# Patient Record
Sex: Female | Born: 1950 | Race: White | Hispanic: No | Marital: Married | Smoking: Never smoker
Health system: Southern US, Community
[De-identification: ages and names within clinical notes are randomized; demographics above are authoritative.]

## PROBLEM LIST (undated history)

## (undated) DIAGNOSIS — I10 Essential (primary) hypertension: Secondary | ICD-10-CM

## (undated) DIAGNOSIS — C801 Malignant (primary) neoplasm, unspecified: Secondary | ICD-10-CM

## (undated) HISTORY — PX: BREAST SURGERY: SHX581

## (undated) HISTORY — PX: MASTECTOMY: SHX3

---

## 2011-10-10 LAB — COMPREHENSIVE METABOLIC PANEL
Albumin: 3.7 g/dL (ref 3.4–5.0)
Alkaline Phosphatase: 99 U/L (ref 50–136)
BUN: 6 mg/dL — ABNORMAL LOW (ref 7–18)
Bilirubin,Total: 0.6 mg/dL (ref 0.2–1.0)
Calcium, Total: 8.9 mg/dL (ref 8.5–10.1)
Co2: 25 mmol/L (ref 21–32)
Creatinine: 0.67 mg/dL (ref 0.60–1.30)
EGFR (Non-African Amer.): >60
Glucose: 125 mg/dL — ABNORMAL HIGH (ref 65–99)
Osmolality: 265 (ref 275–301)
SGOT(AST): 19 U/L (ref 15–37)
SGPT (ALT): 29 U/L
Sodium: 133 mmol/L — ABNORMAL LOW (ref 136–145)
Total Protein: 7.5 g/dL (ref 6.4–8.2)

## 2011-10-10 LAB — CBC WITH DIFFERENTIAL/PLATELET
HCT: 39.9 % (ref 35.0–47.0)
HGB: 13.3 g/dL (ref 12.0–16.0)
Lymphocyte #: 1.3 10*3/uL (ref 1.0–3.6)
Lymphocyte %: 10.8 %
MCHC: 33.4 g/dL (ref 32.0–36.0)
Monocyte #: 0.9 x10 3/mm (ref 0.2–0.9)
Monocyte %: 7.5 %
Neutrophil #: 9.3 10*3/uL — ABNORMAL HIGH (ref 1.4–6.5)
Neutrophil %: 79.7 %
Platelet: 186 10*3/uL (ref 150–440)
RBC: 4.42 10*6/uL (ref 3.80–5.20)
RDW: 14.5 % (ref 11.5–14.5)
WBC: 11.7 10*3/uL — ABNORMAL HIGH (ref 3.6–11.0)

## 2011-10-10 LAB — TROPONIN I: Troponin-I: <0.02 ng/mL

## 2011-10-10 LAB — TSH: Thyroid Stimulating Horm: 0.78 u[IU]/mL

## 2011-10-11 ENCOUNTER — Inpatient Hospital Stay: Payer: Self-pay | Admitting: Internal Medicine

## 2011-10-12 LAB — CBC WITH DIFFERENTIAL/PLATELET
Basophil #: 0 10*3/uL (ref 0.0–0.1)
Eosinophil #: 0.2 10*3/uL (ref 0.0–0.7)
Eosinophil %: 3.2 %
HCT: 33.1 % — ABNORMAL LOW (ref 35.0–47.0)
Lymphocyte #: 1.1 10*3/uL (ref 1.0–3.6)
Lymphocyte %: 16.5 %
MCHC: 33.3 g/dL (ref 32.0–36.0)
MCV: 90 fL (ref 80–100)
Monocyte #: 0.5 x10 3/mm (ref 0.2–0.9)
Monocyte %: 8.3 %
Neutrophil #: 4.7 10*3/uL (ref 1.4–6.5)
Neutrophil %: 71.5 %
Platelet: 165 10*3/uL (ref 150–440)
RDW: 14.3 % (ref 11.5–14.5)
WBC: 6.6 10*3/uL (ref 3.6–11.0)

## 2011-10-12 LAB — BASIC METABOLIC PANEL
Calcium, Total: 7.4 mg/dL — ABNORMAL LOW (ref 8.5–10.1)
Creatinine: 0.41 mg/dL — ABNORMAL LOW (ref 0.60–1.30)
EGFR (African American): >60
EGFR (Non-African Amer.): >60
Glucose: 99 mg/dL (ref 65–99)
Osmolality: 270 (ref 275–301)
Potassium: 3.8 mmol/L (ref 3.5–5.1)

## 2011-10-14 LAB — EXPECTORATED SPUTUM ASSESSMENT W GRAM STAIN, RFLX TO RESP C

## 2011-10-15 LAB — BASIC METABOLIC PANEL
BUN: 5 mg/dL — ABNORMAL LOW (ref 7–18)
Calcium, Total: 8.1 mg/dL — ABNORMAL LOW (ref 8.5–10.1)
Chloride: 110 mmol/L — ABNORMAL HIGH (ref 98–107)
Co2: 25 mmol/L (ref 21–32)
Creatinine: 0.5 mg/dL — ABNORMAL LOW (ref 0.60–1.30)
EGFR (African American): >60
EGFR (Non-African Amer.): >60
Osmolality: 286 (ref 275–301)
Potassium: 3.5 mmol/L (ref 3.5–5.1)
Sodium: 145 mmol/L (ref 136–145)

## 2011-10-16 LAB — CULTURE, BLOOD (SINGLE)

## 2012-12-02 ENCOUNTER — Ambulatory Visit: Payer: Self-pay | Admitting: Otolaryngology

## 2012-12-02 LAB — CREATININE, SERUM: Creatinine: 0.75 mg/dL (ref 0.60–1.30)

## 2013-03-20 ENCOUNTER — Ambulatory Visit: Payer: Self-pay

## 2013-04-03 IMAGING — CR DG CHEST 2V
1 series · 2 of 2 positions shown · non-contrast
Comparison: none

REASON FOR EXAM: follow up pneumonia
COMMENTS:

PROCEDURE:     DXR - DXR CHEST PA (OR AP) AND LATERAL  - October 14, 2011  [DATE]
RESULT:     Comparison: 10/10/2011

[Series 1: pa · 0.17mm/px · 2 of 2 slices shown]
[im 1/2]
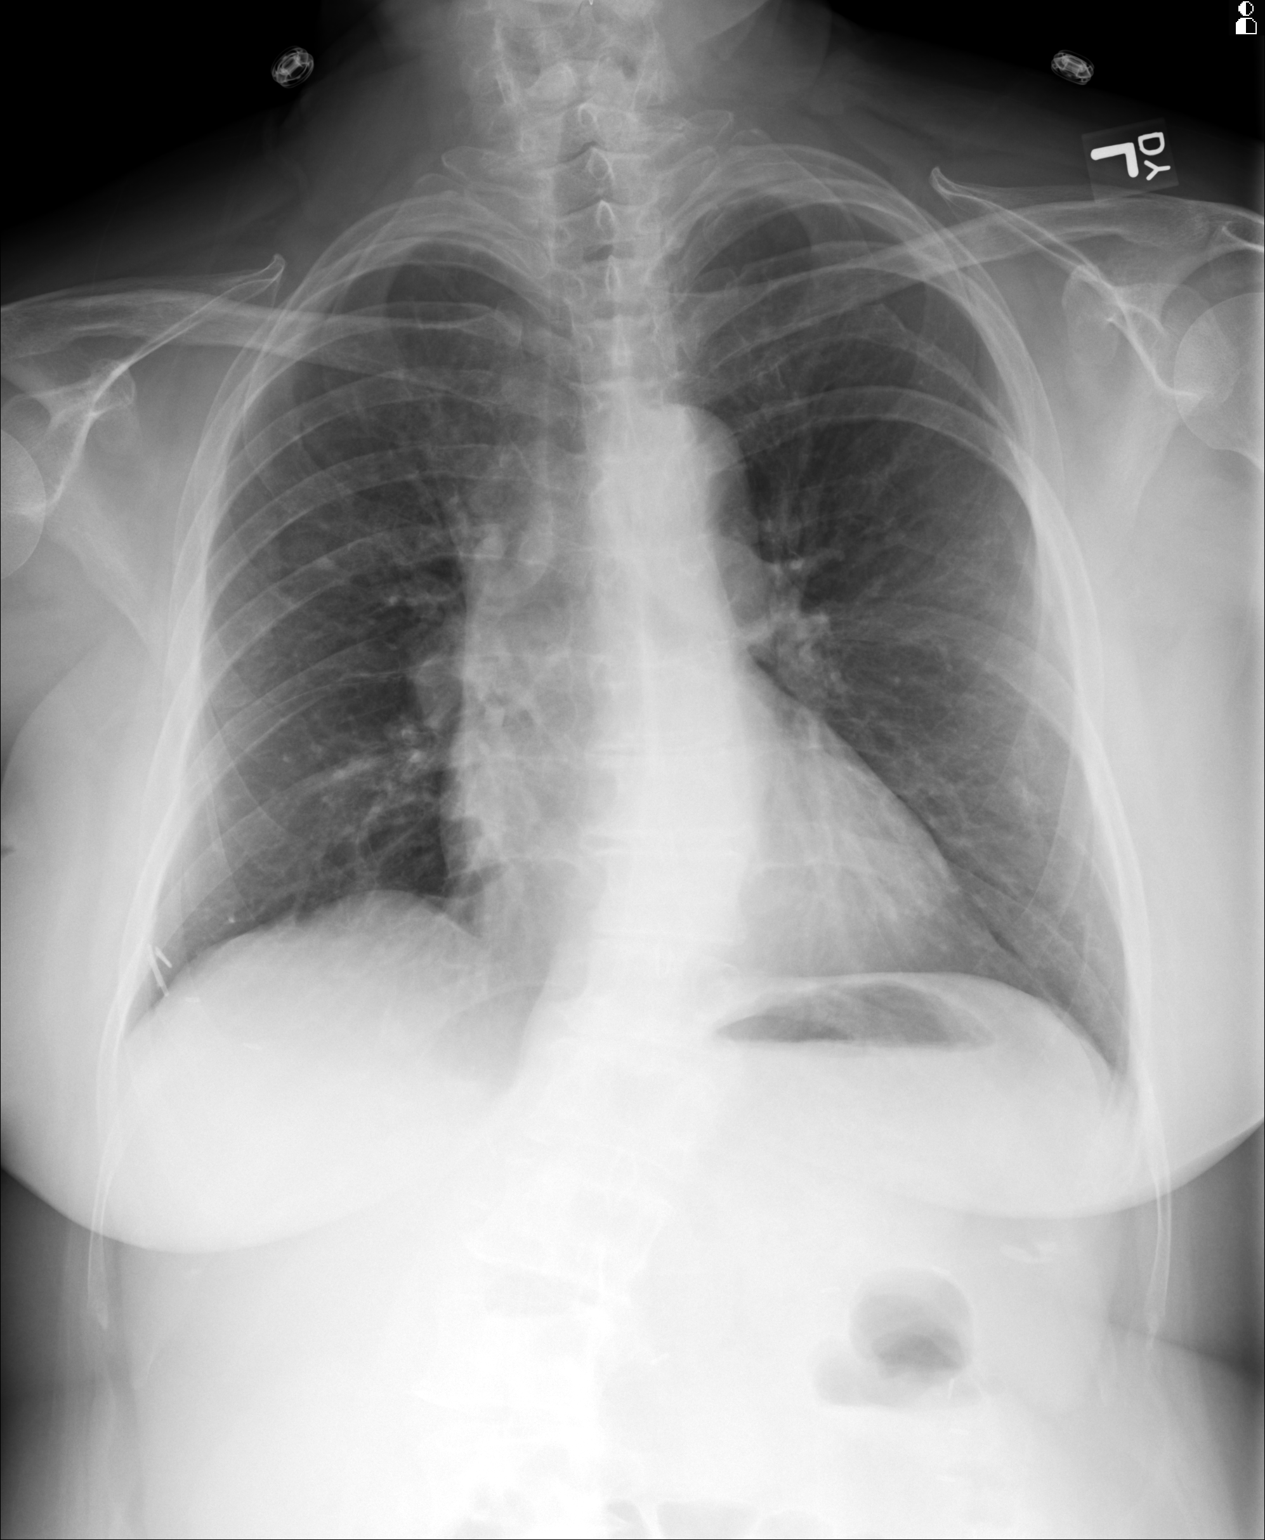
[im 2/2]
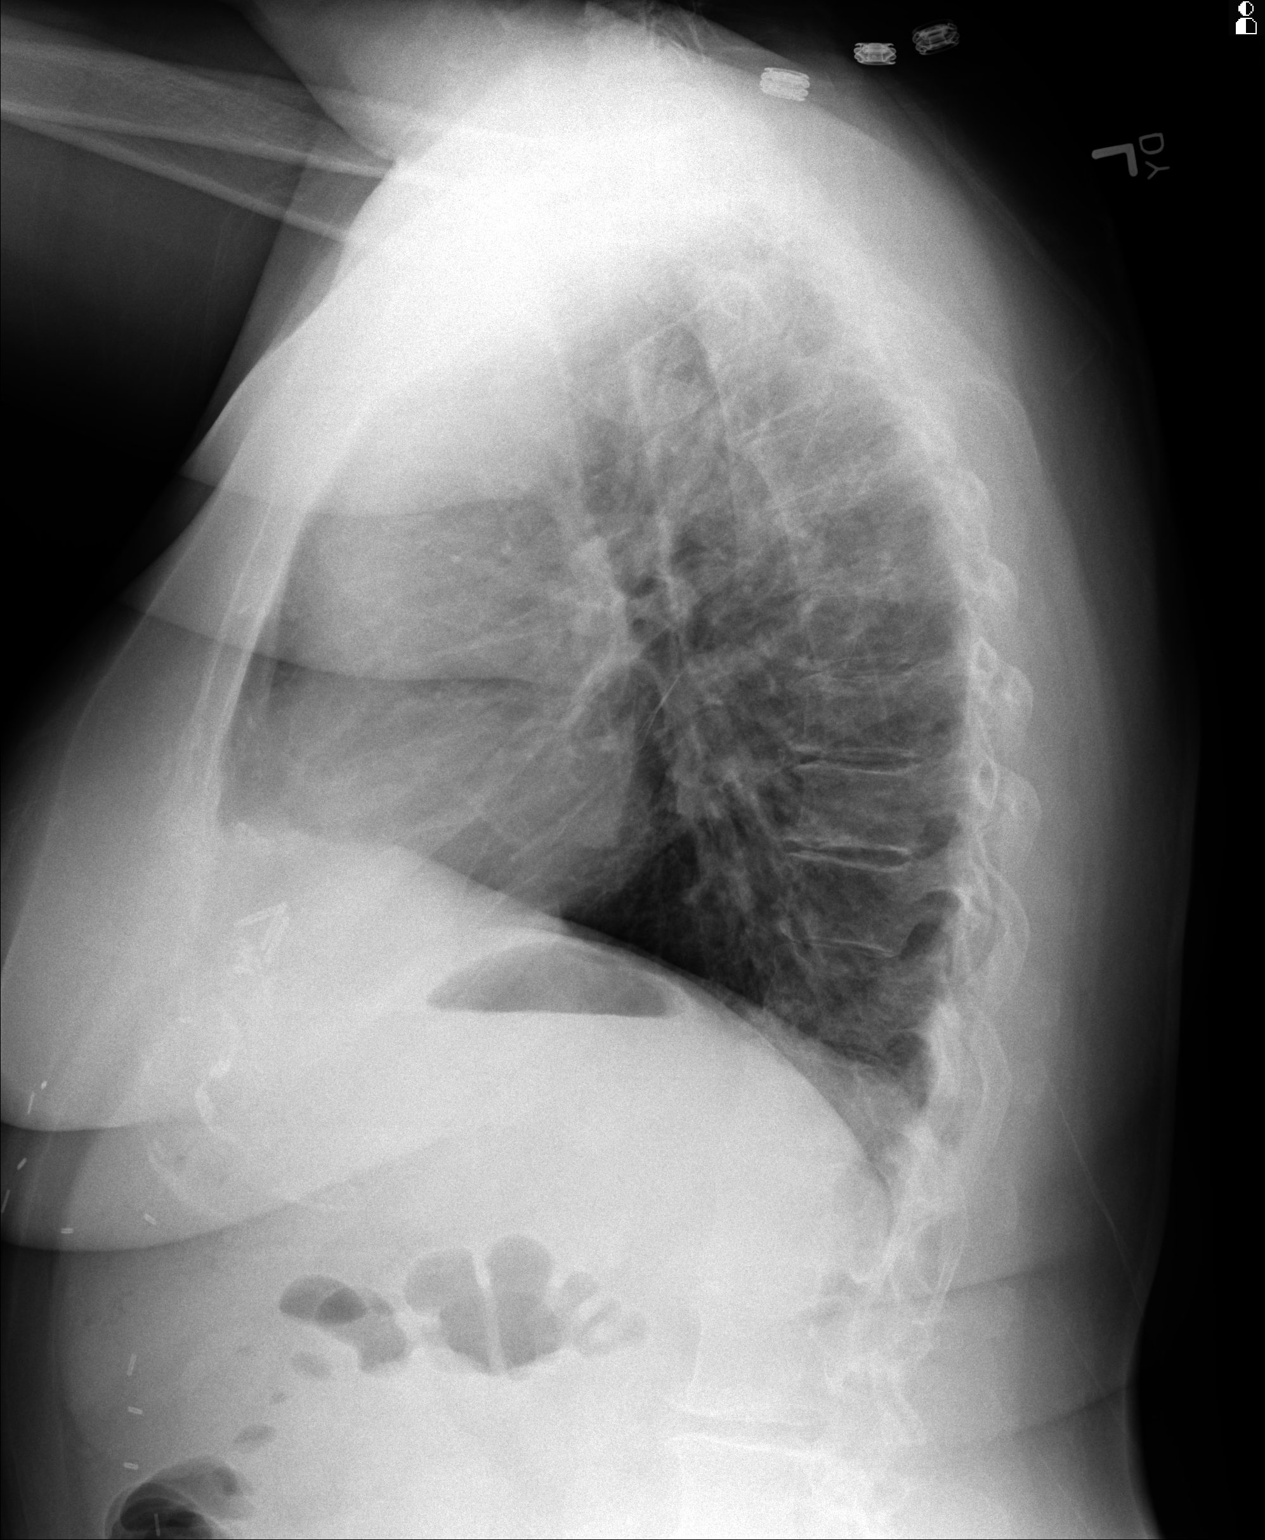

[2 of 2 positions shown; findings below may reference images not displayed]

FINDINGS: PA and lateral chest radiographs are provided.  There is no focal
parenchymal opacity, pleural effusion, or pneumothorax. The heart and
mediastinum are unremarkable.  The osseous structures are unremarkable.
IMPRESSION: No acute disease of the che[REDACTED]

## 2013-04-07 ENCOUNTER — Ambulatory Visit: Payer: Self-pay

## 2013-04-14 ENCOUNTER — Telehealth: Payer: Self-pay | Admitting: *Deleted

## 2013-04-14 NOTE — Telephone Encounter (Signed)
May have a refill 30 pills q.d.

## 2013-04-14 NOTE — Telephone Encounter (Signed)
Fax request from pharmacy req: mobic 15 mg tablets #20 take one tablet every day

## 2013-04-15 ENCOUNTER — Ambulatory Visit (INDEPENDENT_AMBULATORY_CARE_PROVIDER_SITE_OTHER): Payer: 59 | Admitting: Podiatry

## 2013-04-15 ENCOUNTER — Encounter: Payer: Self-pay | Admitting: Podiatry

## 2013-04-15 VITALS — BP 129/86 | HR 96 | Resp 16 | Ht 64.0 in | Wt 183.0 lb

## 2013-04-15 DIAGNOSIS — M722 Plantar fascial fibromatosis: Secondary | ICD-10-CM

## 2013-04-15 MED ORDER — TRIAMCINOLONE ACETONIDE 10 MG/ML IJ SUSP
10.0000 mg | Freq: Once | INTRAMUSCULAR | Status: AC
  Administered 2013-04-15: 10 mg

## 2013-04-15 MED ORDER — DICLOFENAC SODIUM 75 MG PO TBEC: 75.0000 mg | DELAYED_RELEASE_TABLET | Freq: Two times a day (BID) | ORAL | Status: DC

## 2013-04-15 MED ORDER — MELOXICAM 15 MG PO TABS: 15.0000 mg | ORAL_TABLET | Freq: Every day | ORAL | Status: DC

## 2013-04-15 NOTE — Progress Notes (Signed)
   Subjective:    Patient ID: Brittany Duran, female    DOB: Jul 11, 1950, 62 y.o.   MRN: [id number]  HPI Comments:  The heel is just not getting better , last shot worked great. "      Review of Systems  All other systems reviewed and are negative.       Objective:   Physical Exam        Assessment & Plan:

## 2013-04-16 NOTE — Progress Notes (Signed)
Subjective:     Patient ID: Brittany Duran, female   DOB: 28-Jun-1950, 62 y.o.   MRN: [id number]  HPI patient states that my right heel is not getting better if I have been on it a lot. States that the medication has been working but not long-term   Review of Systems     Objective:   Physical Exam Neurovascular status intact with no health history changes noted. Continued discomfort plantar fascia right at the insertional point of the tendon into the calcaneus with orthotics fitting well    Assessment:     Continue plantar fasciitis that I'm trying to get out of the acute phase    Plan:     Discussed importance of night splint and home physical therapy which she has not been doing. Discussed we may need to immobilize this completely or possible it will require surgery but I want her to do a better effort at home. Injected the right plantar fascia 3 mg Kenalog 5 mg Xylocaine Marcaine mixture to reduce the inflammatory complex and reappoint 6 weeks

## 2013-04-29 ENCOUNTER — Encounter: Payer: Self-pay | Admitting: Podiatry

## 2013-04-29 ENCOUNTER — Ambulatory Visit (INDEPENDENT_AMBULATORY_CARE_PROVIDER_SITE_OTHER): Payer: 59 | Admitting: Podiatry

## 2013-04-29 VITALS — BP 125/68 | HR 82 | Resp 16

## 2013-04-29 DIAGNOSIS — M722 Plantar fascial fibromatosis: Secondary | ICD-10-CM

## 2013-04-29 NOTE — Progress Notes (Signed)
   Subjective:    Patient ID: Brittany Duran, female    DOB: 1950-12-01, 62 y.o.   MRN: [id number]  HPI Comments: Im 95 % better      Review of Systems     Objective:   Physical Exam        Assessment & Plan:

## 2013-04-29 NOTE — Progress Notes (Signed)
Subjective:     Patient ID: Brittany Duran, female   DOB: 1950/06/19, 62 y.o.   MRN: [id number]  HPI patient states I'm feeling better on my right heel with minimal discomfort noted but feeling like I need something for the long-term   Review of Systems     Objective:   Physical Exam Neurovascular status intact with no health history changes noted and significant    reduction of discomfort in the right heel Assessment:     Plantar fasciitis improved right    Plan:     Since she has had history of recurrence I did dispense night splint with instructions on usage for stretch and reduced inflammation within the heel. She will be seen back as needed

## 2013-05-08 ENCOUNTER — Ambulatory Visit: Payer: Self-pay

## 2013-05-12 ENCOUNTER — Emergency Department: Payer: Self-pay | Admitting: Emergency Medicine

## 2013-05-12 DIAGNOSIS — R002 Palpitations: Secondary | ICD-10-CM | POA: Diagnosis not present

## 2013-05-12 DIAGNOSIS — R42 Dizziness and giddiness: Secondary | ICD-10-CM | POA: Diagnosis not present

## 2013-05-12 DIAGNOSIS — R5381 Other malaise: Secondary | ICD-10-CM | POA: Diagnosis not present

## 2013-05-12 DIAGNOSIS — J101 Influenza due to other identified influenza virus with other respiratory manifestations: Secondary | ICD-10-CM | POA: Diagnosis not present

## 2013-05-12 DIAGNOSIS — Z79899 Other long term (current) drug therapy: Secondary | ICD-10-CM | POA: Diagnosis not present

## 2013-05-12 DIAGNOSIS — Z853 Personal history of malignant neoplasm of breast: Secondary | ICD-10-CM | POA: Diagnosis not present

## 2013-05-12 DIAGNOSIS — Z9889 Other specified postprocedural states: Secondary | ICD-10-CM | POA: Diagnosis not present

## 2013-05-12 DIAGNOSIS — I10 Essential (primary) hypertension: Secondary | ICD-10-CM | POA: Diagnosis not present

## 2013-05-12 LAB — RAPID INFLUENZA A&B ANTIGENS

## 2013-05-20 DIAGNOSIS — G4700 Insomnia, unspecified: Secondary | ICD-10-CM | POA: Diagnosis not present

## 2013-05-20 DIAGNOSIS — F411 Generalized anxiety disorder: Secondary | ICD-10-CM | POA: Diagnosis not present

## 2013-05-20 DIAGNOSIS — R000 Tachycardia, unspecified: Secondary | ICD-10-CM | POA: Diagnosis not present

## 2013-05-22 DIAGNOSIS — M6281 Muscle weakness (generalized): Secondary | ICD-10-CM | POA: Diagnosis not present

## 2013-05-22 DIAGNOSIS — M545 Low back pain, unspecified: Secondary | ICD-10-CM | POA: Diagnosis not present

## 2013-05-22 DIAGNOSIS — M2560 Stiffness of unspecified joint, not elsewhere classified: Secondary | ICD-10-CM | POA: Diagnosis not present

## 2013-05-22 DIAGNOSIS — IMO0001 Reserved for inherently not codable concepts without codable children: Secondary | ICD-10-CM | POA: Diagnosis not present

## 2013-05-27 DIAGNOSIS — M545 Low back pain, unspecified: Secondary | ICD-10-CM | POA: Diagnosis not present

## 2013-05-27 DIAGNOSIS — IMO0001 Reserved for inherently not codable concepts without codable children: Secondary | ICD-10-CM | POA: Diagnosis not present

## 2013-05-27 DIAGNOSIS — M2560 Stiffness of unspecified joint, not elsewhere classified: Secondary | ICD-10-CM | POA: Diagnosis not present

## 2013-05-27 DIAGNOSIS — M6281 Muscle weakness (generalized): Secondary | ICD-10-CM | POA: Diagnosis not present

## 2013-05-28 DIAGNOSIS — Z853 Personal history of malignant neoplasm of breast: Secondary | ICD-10-CM | POA: Diagnosis not present

## 2013-05-28 DIAGNOSIS — N649 Disorder of breast, unspecified: Secondary | ICD-10-CM | POA: Diagnosis not present

## 2013-05-29 DIAGNOSIS — R002 Palpitations: Secondary | ICD-10-CM | POA: Diagnosis not present

## 2013-05-29 DIAGNOSIS — R0609 Other forms of dyspnea: Secondary | ICD-10-CM | POA: Diagnosis not present

## 2013-05-29 DIAGNOSIS — I10 Essential (primary) hypertension: Secondary | ICD-10-CM | POA: Diagnosis not present

## 2013-06-02 DIAGNOSIS — M545 Low back pain, unspecified: Secondary | ICD-10-CM | POA: Diagnosis not present

## 2013-06-02 DIAGNOSIS — M6281 Muscle weakness (generalized): Secondary | ICD-10-CM | POA: Diagnosis not present

## 2013-06-02 DIAGNOSIS — IMO0001 Reserved for inherently not codable concepts without codable children: Secondary | ICD-10-CM | POA: Diagnosis not present

## 2013-06-02 DIAGNOSIS — M2560 Stiffness of unspecified joint, not elsewhere classified: Secondary | ICD-10-CM | POA: Diagnosis not present

## 2013-06-06 DIAGNOSIS — Z79811 Long term (current) use of aromatase inhibitors: Secondary | ICD-10-CM | POA: Diagnosis not present

## 2013-06-06 DIAGNOSIS — Z5181 Encounter for therapeutic drug level monitoring: Secondary | ICD-10-CM | POA: Diagnosis not present

## 2013-06-06 DIAGNOSIS — C50919 Malignant neoplasm of unspecified site of unspecified female breast: Secondary | ICD-10-CM | POA: Diagnosis not present

## 2013-06-06 DIAGNOSIS — M949 Disorder of cartilage, unspecified: Secondary | ICD-10-CM | POA: Diagnosis not present

## 2013-06-06 DIAGNOSIS — I10 Essential (primary) hypertension: Secondary | ICD-10-CM | POA: Diagnosis not present

## 2013-06-06 DIAGNOSIS — M899 Disorder of bone, unspecified: Secondary | ICD-10-CM | POA: Diagnosis not present

## 2013-06-08 DIAGNOSIS — M6281 Muscle weakness (generalized): Secondary | ICD-10-CM | POA: Diagnosis not present

## 2013-06-08 DIAGNOSIS — IMO0001 Reserved for inherently not codable concepts without codable children: Secondary | ICD-10-CM | POA: Diagnosis not present

## 2013-06-08 DIAGNOSIS — M2560 Stiffness of unspecified joint, not elsewhere classified: Secondary | ICD-10-CM | POA: Diagnosis not present

## 2013-06-08 DIAGNOSIS — M545 Low back pain, unspecified: Secondary | ICD-10-CM | POA: Diagnosis not present

## 2013-07-09 DIAGNOSIS — R0982 Postnasal drip: Secondary | ICD-10-CM | POA: Diagnosis not present

## 2013-07-09 DIAGNOSIS — R059 Cough, unspecified: Secondary | ICD-10-CM | POA: Diagnosis not present

## 2013-07-09 DIAGNOSIS — J3489 Other specified disorders of nose and nasal sinuses: Secondary | ICD-10-CM | POA: Diagnosis not present

## 2013-07-09 DIAGNOSIS — R05 Cough: Secondary | ICD-10-CM | POA: Diagnosis not present

## 2013-07-09 DIAGNOSIS — J328 Other chronic sinusitis: Secondary | ICD-10-CM | POA: Diagnosis not present

## 2013-08-07 DIAGNOSIS — F411 Generalized anxiety disorder: Secondary | ICD-10-CM | POA: Diagnosis not present

## 2013-08-22 DIAGNOSIS — R1310 Dysphagia, unspecified: Secondary | ICD-10-CM | POA: Diagnosis not present

## 2013-08-22 DIAGNOSIS — Z91018 Allergy to other foods: Secondary | ICD-10-CM | POA: Diagnosis not present

## 2013-08-22 DIAGNOSIS — T781XXA Other adverse food reactions, not elsewhere classified, initial encounter: Secondary | ICD-10-CM | POA: Diagnosis not present

## 2013-08-22 DIAGNOSIS — T7809XA Anaphylactic reaction due to other food products, initial encounter: Secondary | ICD-10-CM | POA: Diagnosis not present

## 2013-08-22 DIAGNOSIS — J301 Allergic rhinitis due to pollen: Secondary | ICD-10-CM | POA: Diagnosis not present

## 2013-08-26 DIAGNOSIS — F411 Generalized anxiety disorder: Secondary | ICD-10-CM | POA: Diagnosis not present

## 2013-09-12 ENCOUNTER — Ambulatory Visit (INDEPENDENT_AMBULATORY_CARE_PROVIDER_SITE_OTHER): Payer: 59 | Admitting: Podiatry

## 2013-09-12 VITALS — BP 129/79 | HR 85 | Resp 16

## 2013-09-12 DIAGNOSIS — M722 Plantar fascial fibromatosis: Secondary | ICD-10-CM

## 2013-09-12 MED ORDER — TRIAMCINOLONE ACETONIDE 10 MG/ML IJ SUSP
10.0000 mg | Freq: Once | INTRAMUSCULAR | Status: AC
  Administered 2013-09-12: 10 mg

## 2013-09-12 NOTE — Patient Instructions (Signed)

## 2013-09-14 NOTE — Progress Notes (Signed)
Subjective:     Patient ID: Brittany Duran, female   DOB: [birth year], 63 y.o.   MRN: [id number]  HPI patient states I am getting pain underneath my right heel that has intensified and the last several weeks   Review of Systems     Objective:   Physical Exam Neurovascular status intact with no other health history changes noted and discomfort in the plantar right heel at the insertional point of the tendon into the calcaneus    Assessment:     Plantar fasciitis right with inflammation and pain at the insertion of the tendon    Plan:     Injected the plantar fascia 3 mg Kenalog 5 mg Xylocaine Marcaine mixture advised him reduced activity and supportive therapy. Reappoint her recheck

## 2013-09-17 DIAGNOSIS — R1310 Dysphagia, unspecified: Secondary | ICD-10-CM | POA: Diagnosis not present

## 2013-09-17 DIAGNOSIS — T781XXA Other adverse food reactions, not elsewhere classified, initial encounter: Secondary | ICD-10-CM | POA: Diagnosis not present

## 2013-09-17 DIAGNOSIS — J301 Allergic rhinitis due to pollen: Secondary | ICD-10-CM | POA: Diagnosis not present

## 2013-09-23 DIAGNOSIS — F411 Generalized anxiety disorder: Secondary | ICD-10-CM | POA: Diagnosis not present

## 2013-10-02 DIAGNOSIS — F411 Generalized anxiety disorder: Secondary | ICD-10-CM | POA: Diagnosis not present

## 2013-12-04 DIAGNOSIS — E8801 Alpha-1-antitrypsin deficiency: Secondary | ICD-10-CM | POA: Diagnosis not present

## 2013-12-04 DIAGNOSIS — M899 Disorder of bone, unspecified: Secondary | ICD-10-CM | POA: Diagnosis not present

## 2013-12-04 DIAGNOSIS — C50919 Malignant neoplasm of unspecified site of unspecified female breast: Secondary | ICD-10-CM | POA: Diagnosis not present

## 2013-12-04 DIAGNOSIS — M949 Disorder of cartilage, unspecified: Secondary | ICD-10-CM | POA: Diagnosis not present

## 2013-12-04 DIAGNOSIS — Z853 Personal history of malignant neoplasm of breast: Secondary | ICD-10-CM | POA: Diagnosis not present

## 2013-12-04 DIAGNOSIS — I10 Essential (primary) hypertension: Secondary | ICD-10-CM | POA: Diagnosis not present

## 2013-12-04 DIAGNOSIS — R9389 Abnormal findings on diagnostic imaging of other specified body structures: Secondary | ICD-10-CM | POA: Diagnosis not present

## 2013-12-04 DIAGNOSIS — F39 Unspecified mood [affective] disorder: Secondary | ICD-10-CM | POA: Diagnosis not present

## 2013-12-09 DIAGNOSIS — Z853 Personal history of malignant neoplasm of breast: Secondary | ICD-10-CM | POA: Diagnosis not present

## 2013-12-09 DIAGNOSIS — Z9010 Acquired absence of unspecified breast and nipple: Secondary | ICD-10-CM | POA: Diagnosis not present

## 2013-12-09 DIAGNOSIS — F411 Generalized anxiety disorder: Secondary | ICD-10-CM | POA: Diagnosis not present

## 2013-12-09 DIAGNOSIS — I10 Essential (primary) hypertension: Secondary | ICD-10-CM | POA: Diagnosis not present

## 2013-12-09 DIAGNOSIS — R922 Inconclusive mammogram: Secondary | ICD-10-CM | POA: Diagnosis not present

## 2013-12-09 DIAGNOSIS — Z09 Encounter for follow-up examination after completed treatment for conditions other than malignant neoplasm: Secondary | ICD-10-CM | POA: Diagnosis not present

## 2013-12-09 DIAGNOSIS — K219 Gastro-esophageal reflux disease without esophagitis: Secondary | ICD-10-CM | POA: Diagnosis not present

## 2013-12-09 DIAGNOSIS — G4700 Insomnia, unspecified: Secondary | ICD-10-CM | POA: Diagnosis not present

## 2013-12-09 DIAGNOSIS — Z0000 Encounter for general adult medical examination without abnormal findings: Secondary | ICD-10-CM | POA: Diagnosis not present

## 2013-12-09 DIAGNOSIS — R928 Other abnormal and inconclusive findings on diagnostic imaging of breast: Secondary | ICD-10-CM | POA: Diagnosis not present

## 2013-12-09 DIAGNOSIS — Z124 Encounter for screening for malignant neoplasm of cervix: Secondary | ICD-10-CM | POA: Diagnosis not present

## 2013-12-28 ENCOUNTER — Ambulatory Visit: Payer: Self-pay

## 2013-12-28 DIAGNOSIS — IMO0002 Reserved for concepts with insufficient information to code with codable children: Secondary | ICD-10-CM | POA: Diagnosis not present

## 2013-12-28 DIAGNOSIS — M1710 Unilateral primary osteoarthritis, unspecified knee: Secondary | ICD-10-CM | POA: Diagnosis not present

## 2014-01-09 DIAGNOSIS — M2390 Unspecified internal derangement of unspecified knee: Secondary | ICD-10-CM | POA: Diagnosis not present

## 2014-02-19 DIAGNOSIS — Z85828 Personal history of other malignant neoplasm of skin: Secondary | ICD-10-CM | POA: Diagnosis not present

## 2014-02-19 DIAGNOSIS — L821 Other seborrheic keratosis: Secondary | ICD-10-CM | POA: Diagnosis not present

## 2014-02-19 DIAGNOSIS — L718 Other rosacea: Secondary | ICD-10-CM | POA: Diagnosis not present

## 2014-02-19 DIAGNOSIS — Z08 Encounter for follow-up examination after completed treatment for malignant neoplasm: Secondary | ICD-10-CM | POA: Diagnosis not present

## 2014-02-19 DIAGNOSIS — D225 Melanocytic nevi of trunk: Secondary | ICD-10-CM | POA: Diagnosis not present

## 2014-03-13 ENCOUNTER — Ambulatory Visit: Payer: Self-pay | Admitting: Internal Medicine

## 2014-04-29 DIAGNOSIS — G4459 Other complicated headache syndrome: Secondary | ICD-10-CM | POA: Diagnosis not present

## 2014-04-29 DIAGNOSIS — R200 Anesthesia of skin: Secondary | ICD-10-CM | POA: Diagnosis not present

## 2014-05-20 DIAGNOSIS — R200 Anesthesia of skin: Secondary | ICD-10-CM | POA: Diagnosis not present

## 2014-05-20 DIAGNOSIS — G5602 Carpal tunnel syndrome, left upper limb: Secondary | ICD-10-CM | POA: Diagnosis not present

## 2014-06-16 DIAGNOSIS — M8580 Other specified disorders of bone density and structure, unspecified site: Secondary | ICD-10-CM | POA: Diagnosis not present

## 2014-06-16 DIAGNOSIS — C50811 Malignant neoplasm of overlapping sites of right female breast: Secondary | ICD-10-CM | POA: Diagnosis not present

## 2014-06-16 DIAGNOSIS — E559 Vitamin D deficiency, unspecified: Secondary | ICD-10-CM | POA: Diagnosis not present

## 2014-06-16 DIAGNOSIS — I252 Old myocardial infarction: Secondary | ICD-10-CM | POA: Diagnosis not present

## 2014-06-16 DIAGNOSIS — F39 Unspecified mood [affective] disorder: Secondary | ICD-10-CM | POA: Diagnosis not present

## 2014-06-16 DIAGNOSIS — I10 Essential (primary) hypertension: Secondary | ICD-10-CM | POA: Diagnosis not present

## 2014-06-16 DIAGNOSIS — Z7982 Long term (current) use of aspirin: Secondary | ICD-10-CM | POA: Diagnosis not present

## 2014-08-30 NOTE — Discharge Summary (Signed)
PATIENT NAME:  Brittany Duran, Brittany Duran MR#:  [id number] DATE OF BIRTH:  [birth year]  DATE OF ADMISSION:  10/11/2011 DATE OF DISCHARGE:  10/15/2011   ADMITTING PHYSICIAN: Alounthith Phichith, MD   DISCHARGING PHYSICIAN: Gladstone Lighter, MD   PRIMARY MD: [facility] in [place]: None.    DISCHARGE DIAGNOSES:  1. Systemic inflammatory response syndrome.  2. Left lower lobe pneumonia.  3. Hypertension.  4. Sinus headache.  5. History of stage III breast cancer.  6. Depression/anxiety.   DISCHARGE HOME MEDICATIONS:  1. Cymbalta 60 mg p.o. daily.  2. Hyzaar 50 mg/12.5 mg 1 tablet p.o. daily.  3. Arimidex 1 mg p.o. daily.  4. Xanax 0.25 mg p.o. t.i.d. p.r.n.  5. Ambien 5 mg p.o. at bedtime p.r.n.  6. Augmentin 875 mg p.o. b.i.d. until 10/21/2011.  7. Robitussin-AC 5 mL p.o. q.6 hours p.r.n.  8. Nasonex nasal spray one spray each nostril b.i.d.   DISCHARGE HOME OXYGEN: None.   DISCHARGE DIET: Low sodium diet.   DISCHARGE ACTIVITY: As tolerated.   FOLLOW-UP INSTRUCTIONS:  1. PCP follow-up in 1 to 2 weeks.  2. Follow-up chest x-ray if cough does not improve in two weeks.   LABS AND IMAGING STUDIES: Sodium 145, potassium 3.5, chloride 110, bicarb 25, BUN 5, creatinine 0.5, glucose 92, calcium 8.1, WBC 6.6, hemoglobin 11.0, hematocrit 33.1, platelet count 165. Sputum cultures growing normal flora.   On admission CT of the chest was done with contrast which did not show any PE, left lower lobe interstitial type infiltrate, possibly bronchiectasis suspicious for pneumonia. Underlying COPD is present. Cardiac chambers mildly enlarged without evidence of CHF.   Repeat chest x-ray prior to discharge showing no focal parenchymal opacity, effusion, or pneumothorax. No acute disease of the chest.   CT of the head done for headache without contrast showing consistent with pansinus inflammation with air fluid levels in maxillary and sphenoid sinuses. No  evidence of acute abnormality in the brain parenchyma.   BRIEF HOSPITAL COURSE: Brittany Duran is a 64 year old Caucasian female with past medical history significant for stage III breast cancer of her right breast status post surgery and radical lymph node dissection with chemoradiation, hypertension, depression and anxiety who presented with persistent cough and dyspnea along with left-sided chest pain.  1. Left lower lobe pneumonia as seen on chest x-ray and also CT chest on admission. She was started on Zosyn and azithromycin secondary to her recent history of Pseudomonas sinus infection. She also failed outpatient Levaquin so that was not restarted again while in the hospital. She was initially requiring 3 to 4 liters of oxygen even at rest and that improved over the last couple of days. Currently she is ambulating well. Her breathing has improved and she has been on room air with sats around 96%. Her repeat chest x-ray shows clearing of her infiltrate. Since she is clinically feeling better, her Zosyn has been changed to Augmentin. She did finish five days of Azithromycin while in the hospital. She is being discharged home today.  2. Headache. She had severe headache on admission. CT of the head showed pansinus information with recent history of sinusitis. She was given Nasonex for improvement in her congestion and she will be on antibiotics which will help. Her cough is also making the headache worse and it is improved at the time of discharge.  3. All her other home medications are being continued. Her course has been otherwise uneventful in the hospital.  DISCHARGE CONDITION: Stable.   DISCHARGE DISPOSITION: Home.   TIME SPENT ON DISCHARGE: 35 minutes.   ____________________________ Gladstone Lighter, MD rk:drc D: 10/15/2011 12:21:19 ET T: 10/17/2011 08:14:22 ET JOB#: [id number]  cc: Gladstone Lighter, MD, <Dictator> [facility] MD ELECTRONICALLY SIGNED  10/17/2011 14:21

## 2014-08-30 NOTE — H&P (Signed)
PATIENT NAME:  Brittany Duran, Brittany Duran MR#:  [id number] DATE OF BIRTH:  [birth year]  DATE OF ADMISSION:  10/11/2011  REFERRING PHYSICIAN: Dr. Valetta Close   PRIMARY PHYSICIAN: [facility] in [place]: Left side pain, cough, fever, abdominal pain, shortness of breath, wheezing.   HISTORY OF PRESENT ILLNESS: Brittany Duran is a 64 year old woman who has a history of right breast cancer status post mastectomy and lymph node dissection status post chemoradiation, history of neuropathy, depression, anxiety, hypertension, right upper extremity lymphedema post mastectomy presenting with multiple complaints. The patient reports that on Saturday, which was five days ago, she developed cough, initially dry, that was persistent. She progressed to have productive sputum that was brown, developed a fever yesterday afternoon. When her symptoms worsened, she presented to her primary physician's office and was given Levaquin when chest x-ray was concerning for possible left side lung infection. The patient reports being on several antibiotics in the past few weeks. At the end of May she was bitten by fire ants and was started on steroids and Bactrim which was stopped but then had a bee sting on her right upper extremity and was started on doxycycline yesterday as well as the Levaquin. She denies any vomiting. She does endorse nausea. She endorses wheezing and shortness of breath that developed yesterday afternoon. No syncope. Denies any chest pain. She does endorse congestion also starting yesterday. She has not been feeling well since her symptoms began and was initiated on one dose of the Levaquin with still worsening in her symptoms. Therefore, she presented for evaluation. She also endorses headache but no neck pain or stiffness.   PAST MEDICAL HISTORY:  1. Right breast cancer status post mastectomy and lymph node dissection with resultant right upper extremity lymphedema.  2. Hypertension.   3. Depression and anxiety.  4. Neuropathy and arthralgias post her treatments for breast cancer.  5. History of Pseudomonal and fungal sinus infections.    PAST SURGICAL HISTORY:  1. Mastectomy and lymph node dissection as above.  2. Tonsillectomy and adenoidectomy.  3. Bilateral tubal ligation.  4. Multiple jaw surgeries for congenital defect.  5. Sinoscopy.  6. Transflap reconstruction status post her mastectomy.   SOCIAL HISTORY: She lives in [place] with her husband. No tobacco, alcohol, or drug use. She is a retired Marine scientist.   FAMILY HISTORY: Sister diagnosed with breast cancer after her diagnosis and died six months later. Mother had colon cancer. Father died of emphysema.  REVIEW OF SYSTEMS: CONSTITUTIONAL: Endorses fevers and nausea. EYES: No visual disturbances. ENT: Reports of sinus congestion. She has jaw deformity. RESPIRATORY: As per history of present illness. No hemoptysis. CARDIOVASCULAR: No chest pain or orthopnea. She has right upper extremity chronic edema. No palpitations or syncope. GI: Endorses nausea but no vomiting. She did have diarrhea but last bowel movement was five days ago. She reports abdominal pain from the top to the bottom. No hematemesis or melena. GU: No dysuria or hematuria. ENDOCRINE: No polyuria or polydipsia. HEME: No bleeding. SKIN: She has had rash from her bee stings and cellulitis and fire ant bites. MUSCULOSKELETAL: She has chronic arthralgias. NEUROLOGIC: Has neuropathy. PSYCH: She denies any suicidal ideation but has ongoing depression and anxiety with concerns of recurrence for her breast cancer.   PHYSICAL EXAMINATION:   VITAL SIGNS: Temperature 100.4, pulse 125, respiratory rate 20, blood pressure 132/73, sating at 96% on room air.   GENERAL: Lying in bed, easy tearing.   HEENT: Normocephalic, atraumatic. Pupils equal,  symmetric. Nares slightly boggy. She has moist mucous membranes. Multiple jaw surgeries with deformity.   NECK: Soft and  supple. No adenopathy or JVP.   CARDIOVASCULAR: Tachycardic. No murmurs, rubs, or gallops.   LUNGS: No wheezing. No use of accessory muscles or increased respiratory effort.   ABDOMEN: Soft. Positive bowel sounds. She has some tenderness on palpation diffusely. No rebound or guarding.   EXTREMITIES: No edema. Dorsal pedis pulses intact.   MUSCULOSKELETAL: No joint effusion. She has a right upper extremity swelling compared to her left upper extremity.   PSYCH: She is alert and oriented. The patient is cooperative, again with easy tearing.   NEUROLOGIC: She has dysarthria from her jaw deformity. No focal deficits. Symmetrical strength.   PERTINENT LABS AND STUDIES: WBC 11.7, hemoglobin 13.3, hematocrit 39.9, platelets 186, MCV 90, glucose 125, BUN 6, creatinine 0.67, sodium 133, potassium 3.1, chloride 99, carbon dioxide 25, calcium 8.9. LFTs within normal limits. D-dimer 0.8. ESR 25. Troponin less than 0.02. TSH 0.78.   Her CT angiogram is with consolidative infiltrates in the left lower lobe worrisome for pneumonia. There is no PE. There are mild fibrotic changes in the anterior aspect of the right upper lobe.   EKG with sinus tachycardia, rate of 111. No ST elevation or depression.   ASSESSMENT AND PLAN: Brittany Duran is a 64 year old woman with history of right breast cancer status post mastectomy, chemoradiation, resultant right upper extremity lymphedema, neuropathy, depression, anxiety, and arthralgias presenting with complaint of left-sided pain, abdominal pain, fever, cough, shortness of breath, wheezing.  1. Left lower lobe pneumonia and sepsis. CT chest as above negative for PE. She received one dose of Levaquin outpatient. Will admit for IV therapy as the patient continues to decline in her clinical symptoms. She does have history of Pseudomonal and fungal sinus infection. Will go ahead and convert her to Zosyn. Send blood cultures and sputum cultures. Follow her heart rate, fever,  and WBC.  2. Hypokalemia. Hold her HCTZ. Will replace and send a mag level.  3. Hypertension. As above, holding HCTZ. Resume her losartan.  4. Depression and anxiety, uncontrolled. Followed by psych outpatient. Declined Psych evaluation in-house. Resume her Cymbalta.  5. Prophylaxis with Lovenox.   TIME SPENT: Approximately 50 minutes on patient care.   ____________________________ Rita Ohara, MD ap:drc D: 10/11/2011 05:57:35 ET T: 10/11/2011 08:29:43 ET JOB#: [id number]  cc: Brien Few Maziah Keeling, MD, <Dictator> [facility] in [place] MD ELECTRONICALLY SIGNED 10/27/2011 0:29

## 2014-12-15 DIAGNOSIS — Z79811 Long term (current) use of aromatase inhibitors: Secondary | ICD-10-CM | POA: Diagnosis not present

## 2014-12-15 DIAGNOSIS — C50911 Malignant neoplasm of unspecified site of right female breast: Secondary | ICD-10-CM | POA: Diagnosis not present

## 2014-12-15 DIAGNOSIS — E559 Vitamin D deficiency, unspecified: Secondary | ICD-10-CM | POA: Diagnosis not present

## 2014-12-15 DIAGNOSIS — F419 Anxiety disorder, unspecified: Secondary | ICD-10-CM | POA: Diagnosis not present

## 2014-12-15 DIAGNOSIS — Z5111 Encounter for antineoplastic chemotherapy: Secondary | ICD-10-CM | POA: Diagnosis not present

## 2014-12-15 DIAGNOSIS — Z5181 Encounter for therapeutic drug level monitoring: Secondary | ICD-10-CM | POA: Diagnosis not present

## 2014-12-15 DIAGNOSIS — I10 Essential (primary) hypertension: Secondary | ICD-10-CM | POA: Diagnosis not present

## 2014-12-15 DIAGNOSIS — M8580 Other specified disorders of bone density and structure, unspecified site: Secondary | ICD-10-CM | POA: Diagnosis not present

## 2014-12-15 DIAGNOSIS — C50811 Malignant neoplasm of overlapping sites of right female breast: Secondary | ICD-10-CM | POA: Diagnosis not present

## 2014-12-15 DIAGNOSIS — Z0000 Encounter for general adult medical examination without abnormal findings: Secondary | ICD-10-CM | POA: Diagnosis not present

## 2014-12-15 DIAGNOSIS — Z124 Encounter for screening for malignant neoplasm of cervix: Secondary | ICD-10-CM | POA: Diagnosis not present

## 2014-12-16 DIAGNOSIS — M439 Deforming dorsopathy, unspecified: Secondary | ICD-10-CM | POA: Diagnosis not present

## 2014-12-16 DIAGNOSIS — C50811 Malignant neoplasm of overlapping sites of right female breast: Secondary | ICD-10-CM | POA: Diagnosis not present

## 2014-12-16 DIAGNOSIS — R52 Pain, unspecified: Secondary | ICD-10-CM | POA: Diagnosis not present

## 2014-12-16 DIAGNOSIS — Z853 Personal history of malignant neoplasm of breast: Secondary | ICD-10-CM | POA: Diagnosis not present

## 2014-12-16 DIAGNOSIS — R102 Pelvic and perineal pain: Secondary | ICD-10-CM | POA: Diagnosis not present

## 2014-12-16 DIAGNOSIS — R928 Other abnormal and inconclusive findings on diagnostic imaging of breast: Secondary | ICD-10-CM | POA: Diagnosis not present

## 2014-12-16 DIAGNOSIS — M85852 Other specified disorders of bone density and structure, left thigh: Secondary | ICD-10-CM | POA: Diagnosis not present

## 2014-12-16 DIAGNOSIS — M8580 Other specified disorders of bone density and structure, unspecified site: Secondary | ICD-10-CM | POA: Diagnosis not present

## 2014-12-16 DIAGNOSIS — Z9011 Acquired absence of right breast and nipple: Secondary | ICD-10-CM | POA: Diagnosis not present

## 2014-12-16 DIAGNOSIS — M47814 Spondylosis without myelopathy or radiculopathy, thoracic region: Secondary | ICD-10-CM | POA: Diagnosis not present

## 2014-12-16 DIAGNOSIS — M85851 Other specified disorders of bone density and structure, right thigh: Secondary | ICD-10-CM | POA: Diagnosis not present

## 2014-12-16 DIAGNOSIS — Z780 Asymptomatic menopausal state: Secondary | ICD-10-CM | POA: Diagnosis not present

## 2014-12-16 DIAGNOSIS — N63 Unspecified lump in breast: Secondary | ICD-10-CM | POA: Diagnosis not present

## 2014-12-16 DIAGNOSIS — Z13820 Encounter for screening for osteoporosis: Secondary | ICD-10-CM | POA: Diagnosis not present

## 2015-06-15 DIAGNOSIS — I10 Essential (primary) hypertension: Secondary | ICD-10-CM | POA: Diagnosis not present

## 2015-06-15 DIAGNOSIS — Z170 Estrogen receptor positive status [ER+]: Secondary | ICD-10-CM | POA: Diagnosis not present

## 2015-06-15 DIAGNOSIS — E559 Vitamin D deficiency, unspecified: Secondary | ICD-10-CM | POA: Diagnosis not present

## 2015-06-15 DIAGNOSIS — M8580 Other specified disorders of bone density and structure, unspecified site: Secondary | ICD-10-CM | POA: Diagnosis not present

## 2015-06-15 DIAGNOSIS — Z7982 Long term (current) use of aspirin: Secondary | ICD-10-CM | POA: Diagnosis not present

## 2015-06-15 DIAGNOSIS — C50811 Malignant neoplasm of overlapping sites of right female breast: Secondary | ICD-10-CM | POA: Diagnosis not present

## 2015-06-15 DIAGNOSIS — Z9221 Personal history of antineoplastic chemotherapy: Secondary | ICD-10-CM | POA: Diagnosis not present

## 2015-06-27 DIAGNOSIS — Z760 Encounter for issue of repeat prescription: Secondary | ICD-10-CM | POA: Diagnosis not present

## 2015-07-22 ENCOUNTER — Ambulatory Visit
Admission: EM | Admit: 2015-07-22 | Discharge: 2015-07-22 | Disposition: A | Discharge status: Home / Self Care | Payer: 59 | Attending: Family Medicine | Admitting: Family Medicine

## 2015-07-22 ENCOUNTER — Ambulatory Visit (INDEPENDENT_AMBULATORY_CARE_PROVIDER_SITE_OTHER): Payer: 59

## 2015-07-22 ENCOUNTER — Encounter: Payer: Self-pay | Admitting: *Deleted

## 2015-07-22 DIAGNOSIS — M62830 Muscle spasm of back: Secondary | ICD-10-CM | POA: Diagnosis not present

## 2015-07-22 DIAGNOSIS — M545 Low back pain, unspecified: Secondary | ICD-10-CM

## 2015-07-22 HISTORY — DX: Malignant (primary) neoplasm, unspecified: C80.1

## 2015-07-22 HISTORY — DX: Essential (primary) hypertension: I10

## 2015-07-22 LAB — URINALYSIS COMPLETE WITH MICROSCOPIC (ARMC ONLY)
BILIRUBIN URINE: NEGATIVE
Bacteria, UA: NONE SEEN
GLUCOSE, UA: NEGATIVE mg/dL
Hgb urine dipstick: NEGATIVE
KETONES UR: NEGATIVE mg/dL
Leukocytes, UA: NEGATIVE
Nitrite: NEGATIVE
PROTEIN: NEGATIVE mg/dL
RBC / HPF: NONE SEEN RBC/hpf (ref 0–5)
SPECIFIC GRAVITY, URINE: 1.015 (ref 1.005–1.030)
WBC, UA: NONE SEEN WBC/hpf (ref 0–5)
pH: 7.5 (ref 5.0–8.0)

## 2015-07-22 MED ORDER — TRAMADOL HCL 50 MG PO TABS: 50.0000 mg | ORAL_TABLET | Freq: Two times a day (BID) | ORAL | Status: DC | PRN

## 2015-07-22 MED ORDER — MELOXICAM 15 MG PO TABS: 15.0000 mg | ORAL_TABLET | Freq: Every day | ORAL | Status: AC

## 2015-07-22 MED ORDER — METAXALONE 800 MG PO TABS: 800.0000 mg | ORAL_TABLET | Freq: Three times a day (TID) | ORAL | Status: AC | PRN

## 2015-07-22 NOTE — ED Provider Notes (Signed)
CSN: [id number]     Arrival date & time 07/22/15  0857 History   First MD Initiated Contact with Patient 07/22/15 [phone]    Nurses notes were reviewed.  Patient was seen because of flank pain on the right side. She states that she could feel a twinge on Monday or discomfort which is progressively gotten worse. Last night she was in pain trying to go to bed with the discomfort. She does exercise almost daily basis was unable to exercise daily because the pain. Since she has a history of scoliosis the stem had trouble with muscle spasm before she denies any dysuria or frequency but was worried that she might have a stone. She also be noted patient is a stage III cancer survivor patient who also has had neck and thoracic disc disease as well on MRI. She's had a breast reconstruction flap from her thigh and a breast reduction is essential have occasional pain in the use of areas as well. She had a sister who had neck pain also was a stage III cancer patient and they told her that it was just muscle spasm until he finally found that she was in the metastasis and and died a few months later.  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 She uses Xanax this morning she felt she is up and take the edge off things.As stated above her sister has died from breast cancer   She has recently come back from [place] and was recently placed on antibiotics for sinus infection and nasal sores. She had a history of a mixed fungal sinus infection last year.  Chief Complaint  Patient presents with  . Flank Pain   (Consider location/radiation/quality/duration/timing/severity/associated sxs/prior Treatment) Patient is a 65 y.o. female presenting with flank pain. The history is provided by the patient. No language interpreter was used.  Flank Pain This is a new problem. The current episode started more than 2 days ago. The problem occurs constantly. The  problem has been gradually worsening. Pertinent negatives include no chest pain, no abdominal pain, no headaches and no shortness of breath. The symptoms are aggravated by bending, twisting, walking and exertion. Nothing relieves the symptoms. She has tried nothing for the symptoms.    Past Medical History  Diagnosis Date  . Hypertension   . Cancer [facility])    Past Surgical History  Procedure Laterality Date  . Breast surgery    . Mastectomy     History reviewed. No pertinent family history. Social History  Substance Use Topics  . Smoking status: Never Smoker   . Smokeless tobacco: Never Used  . Alcohol Use: Yes     Comment: glass of wine    OB History    No data available     Review of Systems  Respiratory: Negative for shortness of breath.   Cardiovascular: Negative for chest pain.  Gastrointestinal: Negative for abdominal pain.  Genitourinary: Positive for flank pain.  Skin: Positive for rash.  Neurological: Negative for headaches.    Allergies  Macadamia nut oil and Clindamycin/lincomycin  Home Medications   Prior to Admission medications   Medication Sig Start Date End Date Taking? Authorizing Provider  ALPRAZolam Duanne Moron) 0.5 MG tablet  01/31/13  Yes Historical Provider, MD  anastrozole (ARIMIDEX) 1 MG tablet  04/02/13  Yes Historical Provider, MD  losartan-hydrochlorothiazide (HYZAAR) 100-12.5 MG per tablet  04/02/13  Yes Historical Provider, MD  Dexlansoprazole (DEXILANT PO) Take  by mouth daily.    Historical Provider, MD  meloxicam (MOBIC) 15 MG tablet Take 1 tablet (15 mg total) by mouth daily. 07/22/15   Frederich Cha, MD  metaxalone (SKELAXIN) 800 MG tablet Take 1 tablet (800 mg total) by mouth 3 (three) times daily as needed for muscle spasms. 07/22/15   Frederich Cha, MD  traMADol (ULTRAM) 50 MG tablet Take 1 tablet (50 mg total) by mouth every 12 (twelve) hours as needed. 07/22/15   Frederich Cha, MD  zolpidem Lorrin Mais) 10 MG tablet  03/24/13   Historical Provider, MD     Meds Ordered and Administered this Visit  Medications - No data to display  BP 123/59 mmHg  Pulse 74  Temp(Src) 98 F (36.7 C) (Oral)  Resp 16  Ht 5\' 4"  (1.626 m)  Wt 155 lb (70.308 kg)  BMI 26.59 kg/m2  SpO2 100% No data found.   Physical Exam  Constitutional: She is oriented to person, place, and time. She appears well-developed and well-nourished.  HENT:  Head: Normocephalic and atraumatic.  Eyes: Conjunctivae are normal. Pupils are equal, round, and reactive to light.  Neck: Neck supple.  Musculoskeletal: She exhibits tenderness.       Arms: Patient has muscle skeletal spasms on the right flank consistent with muscle spasm.  Neurological: She is alert and oriented to person, place, and time.  Skin: Rash noted.     She has some skin lesions on the left  Psychiatric: She has a normal mood and affect.  Vitals reviewed.   ED Course  Procedures (including critical care time)  Labs Review Labs Reviewed  URINALYSIS COMPLETEWITH MICROSCOPIC ([place]) - Abnormal; Notable for the following:    Color, Urine STRAW (*)    Squamous Epithelial / LPF 0-5 (*)    All other components within normal limits  URINE CULTURE    Imaging Review Dg Lumbar Spine Complete  07/22/2015  CLINICAL DATA:  Progressing lumbago.  History of breast carcinoma EXAM: LUMBAR SPINE - COMPLETE 4+ VIEW COMPARISON:  None. FINDINGS: Frontal lateral, spot lumbosacral lateral, and bilateral oblique views were obtained. There are 5 non-rib-bearing lumbar type vertebral bodies. There is lumbar dextroscoliosis with a rotatory component. There is no fracture or spondylolisthesis. There is mild disc space narrowing at all levels. There is facet osteoarthritic change at L4-5 on the left at L5-S1 bilaterally. There are surgical clips throughout the abdomen and pelvis. 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 No fracture or spondylolisthesis. Electronically  Signed   By: Lowella Grip III M.D.   On: 07/22/2015 10:24     Visual Acuity Review  Right Eye Distance:   Left Eye Distance:   Bilateral Distance:    Right Eye Near:   Left Eye Near:    Bilateral Near:      Results for orders placed or performed during the hospital encounter of 07/22/15  Urinalysis complete, with microscopic  Result Value Ref Range   Color, Urine STRAW (A) YELLOW   APPearance CLEAR CLEAR   Glucose, UA NEGATIVE NEGATIVE mg/dL   Bilirubin Urine NEGATIVE NEGATIVE   Ketones, ur NEGATIVE NEGATIVE mg/dL   Specific Gravity, Urine 1.015 1.005 - 1.030   Hgb urine dipstick NEGATIVE NEGATIVE   pH 7.5 5.0 - 8.0   Protein, ur NEGATIVE NEGATIVE mg/dL   Nitrite NEGATIVE NEGATIVE   Leukocytes, UA NEGATIVE NEGATIVE   RBC / HPF NONE SEEN 0 - 5 RBC/hpf   WBC, UA NONE SEEN 0 -  5 WBC/hpf   Bacteria, UA NONE SEEN NONE SEEN   Squamous Epithelial / LPF 0-5 (A) NONE SEEN     MDM   1. Muscle spasm of back   2. Right-sided low back pain without sciatica    Patient x-ray was negative for signs metastasis just scoliosis since we knew was present from before, urinalysis was also negative and urine cultures pending. At this time we will place on Skelaxin 800 mg 1 tablet 2-3 times a day and Mobic 15 mg daily tramadol use mostly at night if needed for sleep follow-up with PCP in 1-2 weeks if not improved better.    Frederich Cha, MD 07/22/15 [phone]

## 2015-07-22 NOTE — ED Notes (Signed)
Right flank pain x 2 days. Denies injury, fever, and visible hematuria. Describes pain as sharp and worsens with movement, walking, and certain positions.

## 2015-07-22 NOTE — Discharge Instructions (Signed)
Back Pain, Adult Back pain is very common. The pain often gets better over time. The cause of back pain is usually not dangerous. Most people can learn to manage their back pain on their own.  HOME CARE  Watch your back pain for any changes. The following actions may help to lessen any pain you are feeling:  Stay active. Start with short walks on flat ground if you can. Try to walk farther each day.  Exercise regularly as told by your doctor. Exercise helps your back heal faster. It also helps avoid future injury by keeping your muscles strong and flexible.  Do not sit, drive, or stand in one place for more than 30 minutes.  Do not stay in bed. Resting more than 1-2 days can slow down your recovery.  Be careful when you bend or lift an object. Use good form when lifting:  Bend at your knees.  Keep the object close to your body.  Do not twist.  Sleep on a firm mattress. Lie on your side, and bend your knees. If you lie on your back, put a pillow under your knees.  Take medicines only as told by your doctor.  Put ice on the injured area.  Put ice in a plastic bag.  Place a towel between your skin and the bag.  Leave the ice on for 20 minutes, 2-3 times a day for the first 2-3 days. After that, you can switch between ice and heat packs.  Avoid feeling anxious or stressed. Find good ways to deal with stress, such as exercise.  Maintain a healthy weight. Extra weight puts stress on your back. GET HELP IF:   You have pain that does not go away with rest or medicine.  You have worsening pain that goes down into your legs or buttocks.  You have pain that does not get better in one week.  You have pain at night.  You lose weight.  You have a fever or chills. GET HELP RIGHT AWAY IF:   You cannot control when you poop (bowel movement) or pee (urinate).  Your arms or legs feel weak.  Your arms or legs lose feeling (numbness).  You feel sick to your stomach (nauseous) or  throw up (vomit).  You have belly (abdominal) pain.  You feel like you may pass out (faint).   This information is not intended to replace advice given to you by your health care provider. Make sure you discuss any questions you have with your health care provider.   Document Released: 10/11/2007 Document Revised: 05/15/2014 Document Reviewed: 08/26/2013 Elsevier Interactive Patient Education 2016 Elsevier Inc.  Muscle Cramps and Spasms Muscle cramps and spasms are when muscles tighten by themselves. They usually get better within minutes. Muscle cramps are painful. They are usually stronger and last longer than muscle spasms. Muscle spasms may or may not be painful. They can last a few seconds or much longer. HOME CARE  Drink enough fluid to keep your pee (urine) clear or pale yellow.  Massage, stretch, and relax the muscle.  Use a warm towel, heating pad, or warm shower water on tight muscles.  Place ice on the muscle if it is tender or in pain.  Put ice in a plastic bag.  Place a towel between your skin and the bag.  Leave the ice on for 15-20 minutes, 03-04 times a day.  Only take medicine as told by your doctor. GET HELP RIGHT AWAY IF:  Your cramps or spasms  get worse, happen more often, or do not get better with time. MAKE SURE YOU:  Understand these instructions.  Will watch your condition.  Will get help right away if you are not doing well or get worse.   This information is not intended to replace advice given to you by your health care provider. Make sure you discuss any questions you have with your health care provider.   Document Released: 04/06/2008 Document Revised: 08/19/2012 Document Reviewed: 04/10/2012 Elsevier Interactive Patient Education [employer].

## 2015-09-01 IMAGING — CR RIGHT ELBOW - 2 VIEW
2 series · 2 of 2 positions shown · non-contrast
Comparison: None.

CLINICAL DATA: Pain right lateral proximal radius. Began weight
lifting 2 weeks ago.

EXAM:
RIGHT ELBOW - 2 VIEW

[elbow ap]
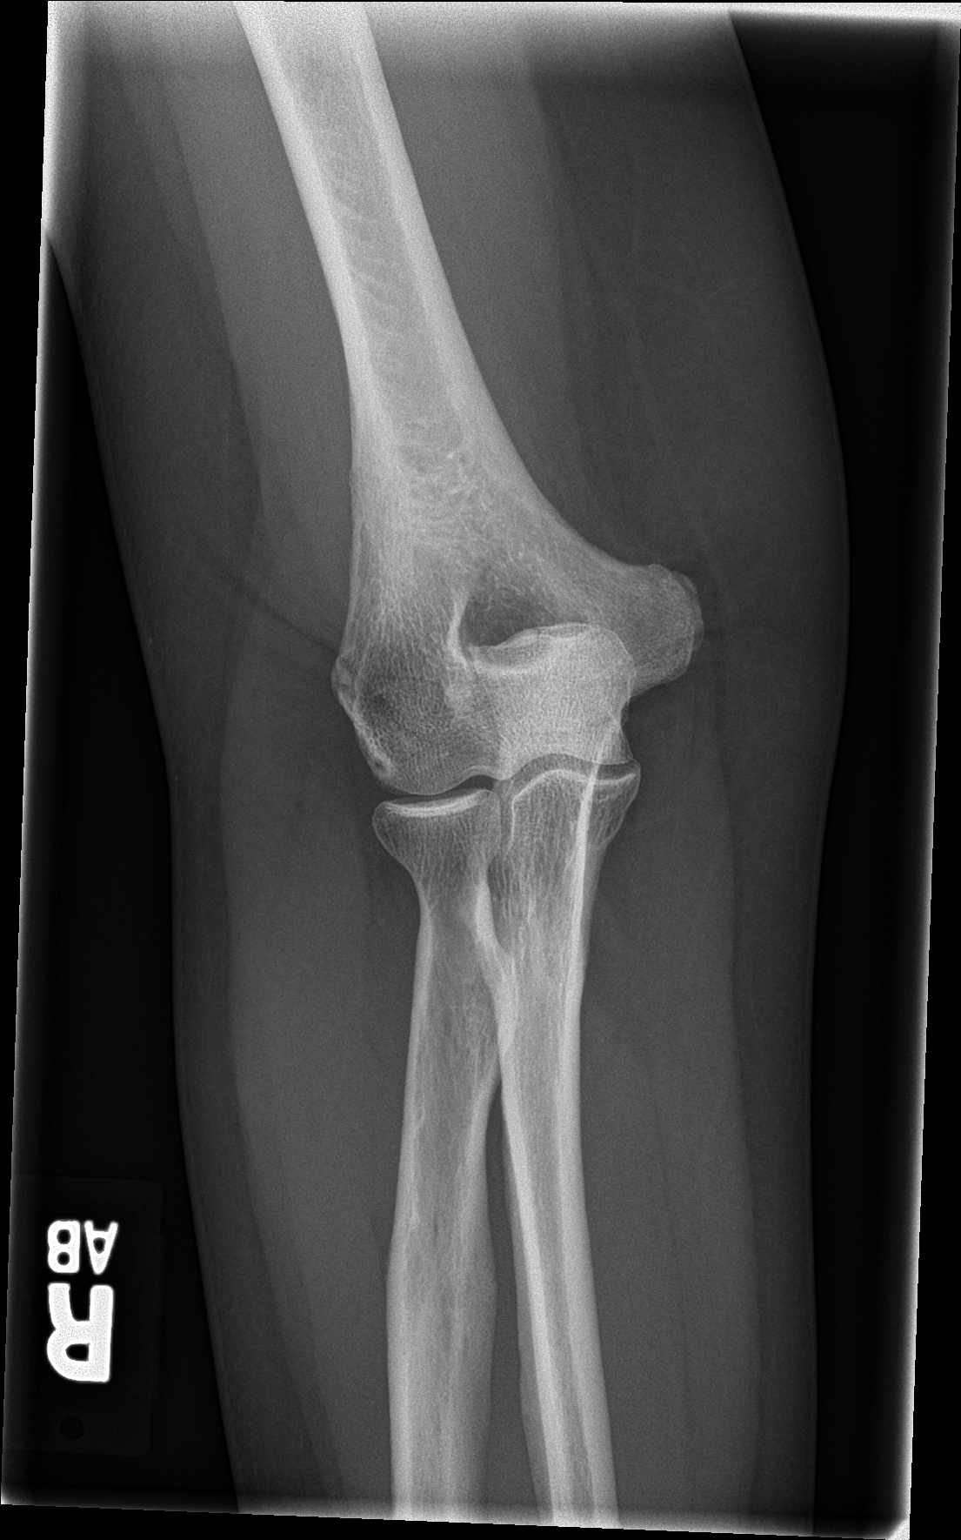

[elbow lat]
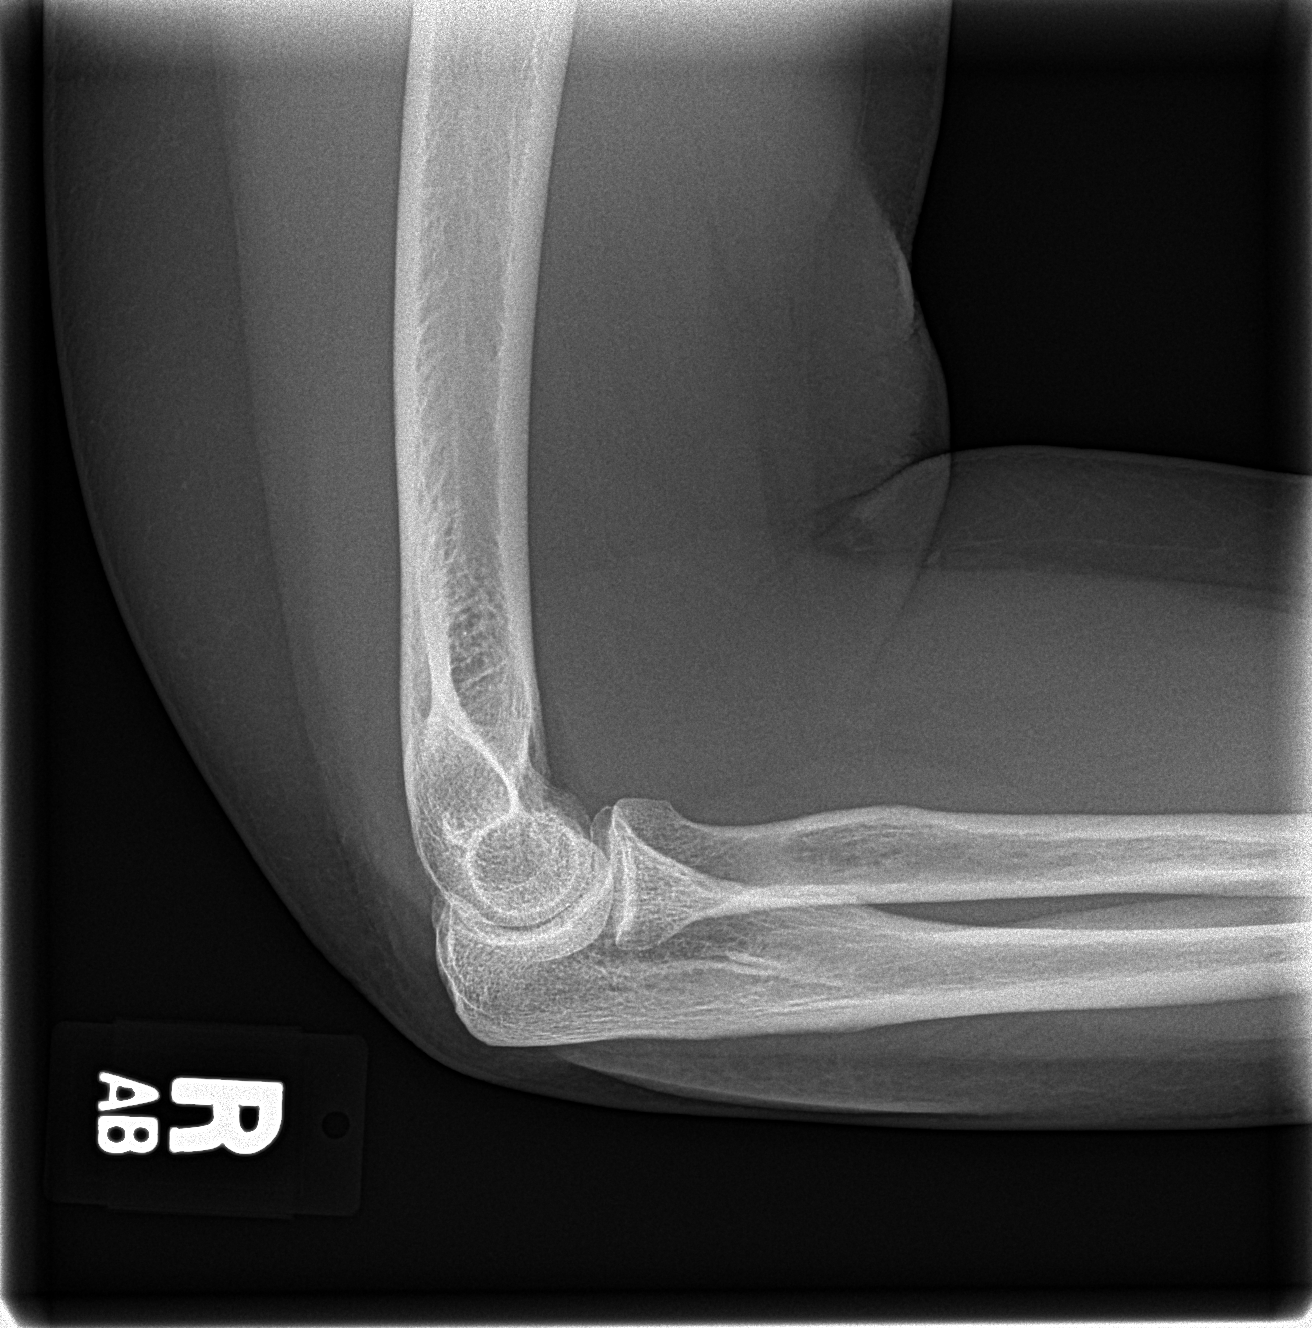

[2 of 2 positions shown; findings below may reference images not displayed]

FINDINGS: There is no evidence of fracture, dislocation, or joint effusion.
There is no evidence of arthropathy or other focal bone abnormality.
Soft tissues are unremarkable.
IMPRESSION: Negative.

## 2015-09-24 ENCOUNTER — Ambulatory Visit
Admission: EM | Admit: 2015-09-24 | Discharge: 2015-09-24 | Disposition: A | Discharge status: Home / Self Care | Payer: 59 | Attending: Family Medicine | Admitting: Family Medicine

## 2015-09-24 ENCOUNTER — Encounter: Payer: Self-pay | Admitting: Emergency Medicine

## 2015-09-24 DIAGNOSIS — J0101 Acute recurrent maxillary sinusitis: Secondary | ICD-10-CM | POA: Diagnosis not present

## 2015-09-24 DIAGNOSIS — J069 Acute upper respiratory infection, unspecified: Secondary | ICD-10-CM | POA: Diagnosis not present

## 2015-09-24 LAB — RAPID STREP SCREEN (MED CTR MEBANE ONLY): STREPTOCOCCUS, GROUP A SCREEN (DIRECT): NEGATIVE

## 2015-09-24 MED ORDER — CIPROFLOXACIN HCL 500 MG PO TABS: 500.0000 mg | ORAL_TABLET | Freq: Two times a day (BID) | ORAL | Status: AC

## 2015-09-24 MED ORDER — BENZONATATE 100 MG PO CAPS: 100.0000 mg | ORAL_CAPSULE | Freq: Three times a day (TID) | ORAL | Status: AC | PRN

## 2015-09-24 MED ORDER — PREDNISONE 10 MG PO TABS: ORAL_TABLET | ORAL | Status: AC

## 2015-09-24 NOTE — Discharge Instructions (Signed)
Take medication as prescribed. Rest. Drink plenty of fluids. Continue home sinus rinses per ENT directions.   Follow up with your ENT this week.   Follow up with your primary care physician this week as needed. Return to Urgent care for new or worsening concerns.    Sinusitis, Adult Sinusitis is redness, soreness, and inflammation of the paranasal sinuses. Paranasal sinuses are air pockets within the bones of your face. They are located beneath your eyes, in the middle of your forehead, and above your eyes. In healthy paranasal sinuses, mucus is able to drain out, and air is able to circulate through them by way of your nose. However, when your paranasal sinuses are inflamed, mucus and air can become trapped. This can allow bacteria and other germs to grow and cause infection. Sinusitis can develop quickly and last only a short time (acute) or continue over a long period (chronic). Sinusitis that lasts for more than 12 weeks is considered chronic. CAUSES Causes of sinusitis include:  Allergies.  Structural abnormalities, such as displacement of the cartilage that separates your nostrils (deviated septum), which can decrease the air flow through your nose and sinuses and affect sinus drainage.  Functional abnormalities, such as when the small hairs (cilia) that line your sinuses and help remove mucus do not work properly or are not present. SIGNS AND SYMPTOMS Symptoms of acute and chronic sinusitis are the same. The primary symptoms are pain and pressure around the affected sinuses. Other symptoms include:  Upper toothache.  Earache.  Headache.  Bad breath.  Decreased sense of smell and taste.  A cough, which worsens when you are lying flat.  Fatigue.  Fever.  Thick drainage from your nose, which often is green and may contain pus (purulent).  Swelling and warmth over the affected sinuses. DIAGNOSIS Your health care provider will perform a physical exam. During your exam, your  health care provider may perform any of the following to help determine if you have acute sinusitis or chronic sinusitis:  Look in your nose for signs of abnormal growths in your nostrils (nasal polyps).  Tap over the affected sinus to check for signs of infection.  View the inside of your sinuses using an imaging device that has a light attached (endoscope). If your health care provider suspects that you have chronic sinusitis, one or more of the following tests may be recommended:  Allergy tests.  Nasal culture. A sample of mucus is taken from your nose, sent to a lab, and screened for bacteria.  Nasal cytology. A sample of mucus is taken from your nose and examined by your health care provider to determine if your sinusitis is related to an allergy. TREATMENT Most cases of acute sinusitis are related to a viral infection and will resolve on their own within 10 days. Sometimes, medicines are prescribed to help relieve symptoms of both acute and chronic sinusitis. These may include pain medicines, decongestants, nasal steroid sprays, or saline sprays. However, for sinusitis related to a bacterial infection, your health care provider will prescribe antibiotic medicines. These are medicines that will help kill the bacteria causing the infection. Rarely, sinusitis is caused by a fungal infection. In these cases, your health care provider will prescribe antifungal medicine. For some cases of chronic sinusitis, surgery is needed. Generally, these are cases in which sinusitis recurs more than 3 times per year, despite other treatments. HOME CARE INSTRUCTIONS  Drink plenty of water. Water helps thin the mucus so your sinuses can drain more  easily.  Use a humidifier.  Inhale steam 3-4 times a day (for example, sit in the bathroom with the shower running).  Apply a warm, moist washcloth to your face 3-4 times a day, or as directed by your health care provider.  Use saline nasal sprays to help  moisten and clean your sinuses.  Take medicines only as directed by your health care provider.  If you were prescribed either an antibiotic or antifungal medicine, finish it all even if you start to feel better. SEEK IMMEDIATE MEDICAL CARE IF:  You have increasing pain or severe headaches.  You have nausea, vomiting, or drowsiness.  You have swelling around your face.  You have vision problems.  You have a stiff neck.  You have difficulty breathing.   This information is not intended to replace advice given to you by your health care provider. Make sure you discuss any questions you have with your health care provider.   Document Released: 04/24/2005 Document Revised: 05/15/2014 Document Reviewed: 05/09/2011 Elsevier Interactive Patient Education 2016 Elsevier Inc.  Upper Respiratory Infection, Adult Most upper respiratory infections (URIs) are a viral infection of the air passages leading to the lungs. A URI affects the nose, throat, and upper air passages. The most common type of URI is nasopharyngitis and is typically referred to as "the common cold." URIs run their course and usually go away on their own. Most of the time, a URI does not require medical attention, but sometimes a bacterial infection in the upper airways can follow a viral infection. This is called a secondary infection. Sinus and middle ear infections are common types of secondary upper respiratory infections. Bacterial pneumonia can also complicate a URI. A URI can worsen asthma and chronic obstructive pulmonary disease (COPD). Sometimes, these complications can require emergency medical care and may be life threatening.  CAUSES Almost all URIs are caused by viruses. A virus is a type of germ and can spread from one person to another.  RISKS FACTORS You may be at risk for a URI if:   You smoke.   You have chronic heart or lung disease.  You have a weakened defense (immune) system.   You are very young  or very old.   You have nasal allergies or asthma.  You work in crowded or poorly ventilated areas.  You work in health care facilities or schools. SIGNS AND SYMPTOMS  Symptoms typically develop 2-3 days after you come in contact with a cold virus. Most viral URIs last 7-10 days. However, viral URIs from the influenza virus (flu virus) can last 14-18 days and are typically more severe. Symptoms may include:   Runny or stuffy (congested) nose.   Sneezing.   Cough.   Sore throat.   Headache.   Fatigue.   Fever.   Loss of appetite.   Pain in your forehead, behind your eyes, and over your cheekbones (sinus pain).  Muscle aches.  DIAGNOSIS  Your health care provider may diagnose a URI by:  Physical exam.  Tests to check that your symptoms are not due to another condition such as:  Strep throat.  Sinusitis.  Pneumonia.  Asthma. TREATMENT  A URI goes away on its own with time. It cannot be cured with medicines, but medicines may be prescribed or recommended to relieve symptoms. Medicines may help:  Reduce your fever.  Reduce your cough.  Relieve nasal congestion. HOME CARE INSTRUCTIONS   Take medicines only as directed by your health care provider.  Gargle warm saltwater or take cough drops to comfort your throat as directed by your health care provider.  Use a warm mist humidifier or inhale steam from a shower to increase air moisture. This may make it easier to breathe.  Drink enough fluid to keep your urine clear or pale yellow.   Eat soups and other clear broths and maintain good nutrition.   Rest as needed.   Return to work when your temperature has returned to normal or as your health care provider advises. You may need to stay home longer to avoid infecting others. You can also use a face mask and careful hand washing to prevent spread of the virus.  Increase the usage of your inhaler if you have asthma.   Do not use any tobacco  products, including cigarettes, chewing tobacco, or electronic cigarettes. If you need help quitting, ask your health care provider. PREVENTION  The best way to protect yourself from getting a cold is to practice good hygiene.   Avoid oral or hand contact with people with cold symptoms.   Wash your hands often if contact occurs.  There is no clear evidence that vitamin C, vitamin E, echinacea, or exercise reduces the chance of developing a cold. However, it is always recommended to get plenty of rest, exercise, and practice good nutrition.  SEEK MEDICAL CARE IF:   You are getting worse rather than better.   Your symptoms are not controlled by medicine.   You have chills.  You have worsening shortness of breath.  You have brown or red mucus.  You have yellow or brown nasal discharge.  You have pain in your face, especially when you bend forward.  You have a fever.  You have swollen neck glands.  You have pain while swallowing.  You have white areas in the back of your throat. SEEK IMMEDIATE MEDICAL CARE IF:   You have severe or persistent:  Headache.  Ear pain.  Sinus pain.  Chest pain.  You have chronic lung disease and any of the following:  Wheezing.  Prolonged cough.  Coughing up blood.  A change in your usual mucus.  You have a stiff neck.  You have changes in your:  Vision.  Hearing.  Thinking.  Mood. MAKE SURE YOU:   Understand these instructions.  Will watch your condition.  Will get help right away if you are not doing well or get worse.   This information is not intended to replace advice given to you by your health care provider. Make sure you discuss any questions you have with your health care provider.   Document Released: 10/18/2000 Document Revised: 09/08/2014 Document Reviewed: 07/30/2013 Elsevier Interactive Patient Education [employer].

## 2015-09-24 NOTE — ED Notes (Signed)
Patient c/o sinus congestion and pressure, sore throat and nasal congestion since Saturday.  Patient reports low grade fever.

## 2015-09-24 NOTE — ED Provider Notes (Signed)
[facility]  ____________________________________________  Time seen: Approximately 11:37 AM  I have reviewed the triage vital signs and the nursing notes.   HISTORY  Chief Complaint Facial Pain and Nasal Congestion   HPI Brittany Duran is a 65 y.o. female presents for the complaint of one week of runny nose, nasal congestion, sinus pressure, sore throat and cough. Patient reports that initially she did not have sinus pressure but that has gradual onset and continued. States sinuses feel clogged and congested. Patient reports frequently blowing her nose and getting thick greenish mucus out. States occasional dry intermittent cough which is more so at night. States also with sore throat.   Patient reports that she does have a chronic history of sinus problems and seasonal allergies. 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 Patient reports that she has had a history of septoplasty as well as sinus endoscopies. Patient reports that in the past she has had sinus infections with Pseudomonas in which she was treated with Cipro and she did not tolerate Levaquin as well, states her sinus infections are normally treated with cipro due to history of pseudomonas infection. Patient also reports having to follow with infectious disease at [facility] in the past as sinus endoscopy culture reports had yielded fungal growths as well but states that she did not have to ever take any antifungals but rather was treated with oral antibiotics and prednisone and improved. Patient reports that her current symptoms are similar to previous sinus infections however with more of a sore throat than normal. Denies any recent sick contacts. Patient reports that she has had intermittent warm and cold chills but denies fevers. Reports follows regularly with Dr. Pryor Ochoa ENT.  Denies any recent travel or recent  sickness. Denies antibiotic use in the last 3-4 months. Denies any vision changes, ear pain, pain radiation, dizziness, weakness, neck pain or back pain. Reports has continued to eat and drink well. Reports that she does perform sinus irrigations with saline rinses daily on a regular basis that she's been doing for years. Patient states that she is here today as her normal saline rinses has not resolved her sinus pressure and sinus drainage.Denies known fevers. Denies chest pain, shortness of breath, chest pain with deep breath. Denies dizziness, weakness, abdominal pain, dysuria, neck pain, back pain, weakness, extremity pain, extremity swelling or other complaints.  PCP: Nacouzi ENT: [place]  No LMP recorded. Patient is postmenopausal.    Past Medical History  Diagnosis Date  . Hypertension   . Cancer [facility])   Breast Cancer  There are no active problems to display for this patient.   Past Surgical History  Procedure Laterality Date  . Breast surgery    . Mastectomy      Current Outpatient Rx  Name  Route  Sig  Dispense  Refill  . ALPRAZolam (XANAX) 0.5 MG tablet               . anastrozole (ARIMIDEX) 1 MG tablet               . Dexlansoprazole (DEXILANT PO)   Oral   Take by mouth daily.         Marland Kitchen losartan-hydrochlorothiazide (HYZAAR) 100-12.5 MG per tablet               .           Marland Kitchen           Marland Kitchen           Marland Kitchen  zolpidem (AMBIEN) 10 MG tablet                 Allergies Macadamia nut oil and Clindamycin/lincomycin  History reviewed. No pertinent family history.  Social History Social History  Substance Use Topics  . Smoking status: Never Smoker   . Smokeless tobacco: Never Used  . Alcohol Use: Yes     Comment: glass of wine     Review of Systems Constitutional: as above Eyes: No visual changes. ENT: as above Cardiovascular: Denies chest pain. Respiratory: Denies shortness of breath. Gastrointestinal: No abdominal pain.  No nausea, no vomiting.   No diarrhea.  No constipation. Genitourinary: Negative for dysuria. Musculoskeletal: Negative for back pain. Skin: Negative for rash. Neurological: Negative for headaches, focal weakness or numbness.  10-point ROS otherwise negative.  ____________________________________________   PHYSICAL EXAM:  VITAL SIGNS: ED Triage Vitals  Enc Vitals Group     BP 09/24/15 1024 122/70 mmHg     Pulse Rate 09/24/15 1024 97     Resp 09/24/15 1024 16     Temp 09/24/15 1024 98.9 F (37.2 C)     Temp Source 09/24/15 1024 Tympanic     SpO2 09/24/15 1024 100 %     Weight 09/24/15 1024 155 lb (70.308 kg)     Height 09/24/15 1024 5\' 4"  (1.626 m)     Head Cir --      Peak Flow --      Pain Score 09/24/15 1025 5     Pain Loc --      Pain Edu? --      Excl. in [place]? --   Constitutional: Alert and oriented. Well appearing and in no acute distress. Eyes: Conjunctivae are normal. PERRL. EOMI. Head: Atraumatic.Mild to moderate tenderness to palpation bilateral frontal and maxillary sinuses. No swelling. No erythema.   Ears: no erythema, normal TM right; no left ear canal.   Nose: nasal congestion with bilateral nasal turbinate erythema and edema.   Mouth/Throat: Mucous membranes are moist.  Mild pharyngeal erythema.No tonsillar swelling or exudate.  Neck: No stridor.  No cervical spine tenderness to palpation. Hematological/Lymphatic/Immunilogical: No cervical lymphadenopathy. Cardiovascular: Normal rate, regular rhythm. Grossly normal heart sounds.  Good peripheral circulation. Respiratory: Normal respiratory effort.  No retractions. Lungs CTAB. No wheezes, rales or rhonchi. Good air movement.  Gastrointestinal: Soft and nontender. No distention. Normal Bowel sounds. No CVA tenderness. Musculoskeletal: No lower or upper extremity tenderness nor edema.  Bilateral pedal pulses equal and easily palpated. No cervical, thoracic or lumbar tenderness to palpation.  Neurologic:  Normal speech and language. No  gross focal neurologic deficits are appreciated. No gait instability.Steady gait. 5 out of 5 strength to bilateral upper and lower extremities with sensation intact bilaterally. Skin:  Skin is warm, dry and intact. No rash noted. Psychiatric: Mood and affect are normal. Speech and behavior are normal.  ____________________________________________   LABS (all labs ordered are listed, but only abnormal results are displayed)  Labs Reviewed  RAPID STREP SCREEN (NOT AT [facility])  CULTURE, GROUP A STREP [facility])    INITIAL IMPRESSION / ASSESSMENT AND PLAN / ED COURSE  Pertinent labs & imaging results that were available during my care of the patient were reviewed by me and considered in my medical decision making (see chart for details).  Very well-appearing patient. No acute distress. Presents for the complaints of 1 week of runny nose, nasal congestion, sinus pressure and sinus rate is with accompanying sore throat and intermittent cough. Patient with  history of chronic sinusitis and allergic rhinitis as well as sinusitis second to site Pseudomonas in the past. Well appearing patient. Lungs clear throughout. Abdomen soft and nontender. Suspect sinusitis. As with past history of Pseudomonas, will treat patient with oral Cipro as well as prednisone taper. PRN tessalon perles.  Encourage close follow-up with ENT or PCP. Patient reports that she has ENT appointment this coming Monday morning. Encouraged patient to seek sooner evaluation for any change or worsening concerns. Discussed indication, risks and benefits of medications with patient.  Discussed follow up with Primary care physician this week. Discussed follow up and return parameters including no resolution or any worsening concerns. Patient verbalized understanding and agreed to plan.   ____________________________________________   FINAL CLINICAL IMPRESSION(S) / ED DIAGNOSES  Final diagnoses:  Acute recurrent maxillary sinusitis  Upper  respiratory infection      Note: This dictation was prepared with Dragon dictation along with smaller phrase technology. Any transcriptional errors that result from this process are unintentional.    Marylene Land, NP 09/24/15 [place], NP 09/24/15 1627

## 2015-09-26 LAB — CULTURE, GROUP A STREP (THRC)

## 2015-09-27 DIAGNOSIS — M26603 Bilateral temporomandibular joint disorder, unspecified: Secondary | ICD-10-CM | POA: Diagnosis not present

## 2015-09-27 DIAGNOSIS — Q169 Congenital malformation of ear causing impairment of hearing, unspecified: Secondary | ICD-10-CM | POA: Diagnosis not present

## 2015-09-27 DIAGNOSIS — J301 Allergic rhinitis due to pollen: Secondary | ICD-10-CM | POA: Diagnosis not present

## 2015-09-27 DIAGNOSIS — J320 Chronic maxillary sinusitis: Secondary | ICD-10-CM | POA: Diagnosis not present

## 2015-10-21 DIAGNOSIS — L718 Other rosacea: Secondary | ICD-10-CM | POA: Diagnosis not present

## 2015-10-21 DIAGNOSIS — D225 Melanocytic nevi of trunk: Secondary | ICD-10-CM | POA: Diagnosis not present

## 2015-10-21 DIAGNOSIS — Z08 Encounter for follow-up examination after completed treatment for malignant neoplasm: Secondary | ICD-10-CM | POA: Diagnosis not present

## 2015-10-21 DIAGNOSIS — Z85828 Personal history of other malignant neoplasm of skin: Secondary | ICD-10-CM | POA: Diagnosis not present

## 2015-12-13 DIAGNOSIS — Z853 Personal history of malignant neoplasm of breast: Secondary | ICD-10-CM | POA: Diagnosis not present

## 2015-12-13 DIAGNOSIS — Z780 Asymptomatic menopausal state: Secondary | ICD-10-CM | POA: Diagnosis not present

## 2015-12-13 DIAGNOSIS — M8580 Other specified disorders of bone density and structure, unspecified site: Secondary | ICD-10-CM | POA: Diagnosis not present

## 2015-12-13 DIAGNOSIS — M8589 Other specified disorders of bone density and structure, multiple sites: Secondary | ICD-10-CM | POA: Diagnosis not present

## 2015-12-13 DIAGNOSIS — C50811 Malignant neoplasm of overlapping sites of right female breast: Secondary | ICD-10-CM | POA: Diagnosis not present

## 2015-12-13 DIAGNOSIS — R922 Inconclusive mammogram: Secondary | ICD-10-CM | POA: Diagnosis not present

## 2015-12-16 DIAGNOSIS — M25552 Pain in left hip: Secondary | ICD-10-CM | POA: Diagnosis not present

## 2015-12-16 DIAGNOSIS — C50811 Malignant neoplasm of overlapping sites of right female breast: Secondary | ICD-10-CM | POA: Diagnosis not present

## 2015-12-16 DIAGNOSIS — E8801 Alpha-1-antitrypsin deficiency: Secondary | ICD-10-CM | POA: Diagnosis not present

## 2015-12-22 DIAGNOSIS — M25551 Pain in right hip: Secondary | ICD-10-CM | POA: Diagnosis not present

## 2015-12-22 DIAGNOSIS — M25552 Pain in left hip: Secondary | ICD-10-CM | POA: Diagnosis not present

## 2015-12-22 DIAGNOSIS — C50811 Malignant neoplasm of overlapping sites of right female breast: Secondary | ICD-10-CM | POA: Diagnosis not present

## 2015-12-22 DIAGNOSIS — M898X8 Other specified disorders of bone, other site: Secondary | ICD-10-CM | POA: Diagnosis not present

## 2015-12-24 DIAGNOSIS — M25551 Pain in right hip: Secondary | ICD-10-CM | POA: Diagnosis not present

## 2015-12-27 ENCOUNTER — Other Ambulatory Visit: Payer: Self-pay | Admitting: Orthopedic Surgery

## 2015-12-27 DIAGNOSIS — R102 Pelvic and perineal pain: Secondary | ICD-10-CM

## 2016-01-03 ENCOUNTER — Ambulatory Visit (HOSPITAL_COMMUNITY)
Admission: RE | Admit: 2016-01-03 | Discharge: 2016-01-03 | Disposition: A | Discharge status: Home / Self Care | Payer: 59 | Source: Ambulatory Visit | Attending: Orthopedic Surgery | Admitting: Orthopedic Surgery

## 2016-01-03 DIAGNOSIS — R600 Localized edema: Secondary | ICD-10-CM | POA: Diagnosis not present

## 2016-01-03 DIAGNOSIS — M25551 Pain in right hip: Secondary | ICD-10-CM | POA: Insufficient documentation

## 2016-01-03 DIAGNOSIS — R102 Pelvic and perineal pain: Secondary | ICD-10-CM

## 2016-01-07 ENCOUNTER — Ambulatory Visit: Payer: Medicare Other

## 2016-05-28 DIAGNOSIS — R05 Cough: Secondary | ICD-10-CM | POA: Diagnosis not present

## 2016-05-28 DIAGNOSIS — J40 Bronchitis, not specified as acute or chronic: Secondary | ICD-10-CM | POA: Diagnosis not present

## 2016-06-05 DIAGNOSIS — M25512 Pain in left shoulder: Secondary | ICD-10-CM | POA: Diagnosis not present

## 2016-06-12 DIAGNOSIS — M7580 Other shoulder lesions, unspecified shoulder: Secondary | ICD-10-CM | POA: Diagnosis not present

## 2016-06-12 DIAGNOSIS — I10 Essential (primary) hypertension: Secondary | ICD-10-CM | POA: Diagnosis not present

## 2016-06-20 DIAGNOSIS — E559 Vitamin D deficiency, unspecified: Secondary | ICD-10-CM | POA: Diagnosis not present

## 2016-06-20 DIAGNOSIS — Z79811 Long term (current) use of aromatase inhibitors: Secondary | ICD-10-CM | POA: Diagnosis not present

## 2016-06-20 DIAGNOSIS — M25551 Pain in right hip: Secondary | ICD-10-CM | POA: Diagnosis not present

## 2016-06-20 DIAGNOSIS — I10 Essential (primary) hypertension: Secondary | ICD-10-CM | POA: Diagnosis not present

## 2016-06-20 DIAGNOSIS — F39 Unspecified mood [affective] disorder: Secondary | ICD-10-CM | POA: Diagnosis not present

## 2016-06-20 DIAGNOSIS — F419 Anxiety disorder, unspecified: Secondary | ICD-10-CM | POA: Diagnosis not present

## 2016-06-20 DIAGNOSIS — R937 Abnormal findings on diagnostic imaging of other parts of musculoskeletal system: Secondary | ICD-10-CM | POA: Diagnosis not present

## 2016-06-20 DIAGNOSIS — Z170 Estrogen receptor positive status [ER+]: Secondary | ICD-10-CM | POA: Diagnosis not present

## 2016-06-20 DIAGNOSIS — M8580 Other specified disorders of bone density and structure, unspecified site: Secondary | ICD-10-CM | POA: Diagnosis not present

## 2016-06-20 DIAGNOSIS — C50811 Malignant neoplasm of overlapping sites of right female breast: Secondary | ICD-10-CM | POA: Diagnosis not present

## 2016-06-20 DIAGNOSIS — Z5181 Encounter for therapeutic drug level monitoring: Secondary | ICD-10-CM | POA: Diagnosis not present

## 2016-06-27 DIAGNOSIS — M439 Deforming dorsopathy, unspecified: Secondary | ICD-10-CM | POA: Diagnosis not present

## 2016-06-27 DIAGNOSIS — M419 Scoliosis, unspecified: Secondary | ICD-10-CM | POA: Diagnosis not present

## 2016-06-27 DIAGNOSIS — M4134 Thoracogenic scoliosis, thoracic region: Secondary | ICD-10-CM | POA: Diagnosis not present

## 2016-06-27 DIAGNOSIS — Z170 Estrogen receptor positive status [ER+]: Secondary | ICD-10-CM | POA: Diagnosis not present

## 2016-06-27 DIAGNOSIS — C50811 Malignant neoplasm of overlapping sites of right female breast: Secondary | ICD-10-CM | POA: Diagnosis not present

## 2016-06-27 DIAGNOSIS — M4186 Other forms of scoliosis, lumbar region: Secondary | ICD-10-CM | POA: Diagnosis not present

## 2016-06-27 DIAGNOSIS — M47815 Spondylosis without myelopathy or radiculopathy, thoracolumbar region: Secondary | ICD-10-CM | POA: Diagnosis not present

## 2017-11-15 ENCOUNTER — Emergency Department
Admission: EM | Admit: 2017-11-15 | Discharge: 2017-11-15 | Disposition: A | Discharge status: Home / Self Care | Payer: Medicare HMO | Attending: Emergency Medicine | Admitting: Emergency Medicine

## 2017-11-15 ENCOUNTER — Other Ambulatory Visit: Payer: Self-pay

## 2017-11-15 DIAGNOSIS — T402X1A Poisoning by other opioids, accidental (unintentional), initial encounter: Secondary | ICD-10-CM | POA: Diagnosis not present

## 2017-11-15 DIAGNOSIS — G8929 Other chronic pain: Secondary | ICD-10-CM

## 2017-11-15 DIAGNOSIS — M5440 Lumbago with sciatica, unspecified side: Secondary | ICD-10-CM | POA: Diagnosis not present

## 2017-11-15 DIAGNOSIS — I10 Essential (primary) hypertension: Secondary | ICD-10-CM | POA: Diagnosis not present

## 2017-11-15 DIAGNOSIS — Z79899 Other long term (current) drug therapy: Secondary | ICD-10-CM | POA: Diagnosis not present

## 2017-11-15 DIAGNOSIS — M545 Low back pain: Secondary | ICD-10-CM | POA: Diagnosis present

## 2017-11-15 DIAGNOSIS — T7840XA Allergy, unspecified, initial encounter: Secondary | ICD-10-CM

## 2017-11-15 LAB — CBC WITH DIFFERENTIAL/PLATELET
BASOS ABS: 0.1 10*3/uL (ref 0–0.1)
BASOS PCT: 1 %
Eosinophils Absolute: 0.2 10*3/uL (ref 0–0.7)
Eosinophils Relative: 3 %
HEMATOCRIT: 37.1 % (ref 35.0–47.0)
Hemoglobin: 12.9 g/dL (ref 12.0–16.0)
Lymphocytes Relative: 26 %
Lymphs Abs: 2 10*3/uL (ref 1.0–3.6)
MCH: 31.8 pg (ref 26.0–34.0)
MCHC: 34.8 g/dL (ref 32.0–36.0)
MCV: 91.4 fL (ref 80.0–100.0)
MONOS PCT: 7 %
Monocytes Absolute: 0.6 10*3/uL (ref 0.2–0.9)
NEUTROS ABS: 4.7 10*3/uL (ref 1.4–6.5)
Neutrophils Relative %: 63 %
Platelets: 251 10*3/uL (ref 150–440)
RBC: 4.05 MIL/uL (ref 3.80–5.20)
RDW: 14.2 % (ref 11.5–14.5)
WBC: 7.5 10*3/uL (ref 3.6–11.0)

## 2017-11-15 LAB — COMPREHENSIVE METABOLIC PANEL
ALBUMIN: 4.3 g/dL (ref 3.5–5.0)
ALK PHOS: 44 U/L (ref 38–126)
ALT: 21 U/L (ref 0–44)
AST: 21 U/L (ref 15–41)
Anion gap: 8 (ref 5–15)
BILIRUBIN TOTAL: 0.6 mg/dL (ref 0.3–1.2)
BUN: 15 mg/dL (ref 8–23)
CALCIUM: 9.5 mg/dL (ref 8.9–10.3)
CO2: 28 mmol/L (ref 22–32)
Chloride: 103 mmol/L (ref 98–111)
Creatinine, Ser: 0.74 mg/dL (ref 0.44–1.00)
GFR calc Af Amer: >60 mL/min (ref >60)
GFR calc non Af Amer: >60 mL/min (ref >60)
GLUCOSE: 98 mg/dL (ref 70–99)
Potassium: 4.2 mmol/L (ref 3.5–5.1)
SODIUM: 139 mmol/L (ref 135–145)
TOTAL PROTEIN: 7 g/dL (ref 6.5–8.1)

## 2017-11-15 LAB — TROPONIN I: Troponin I: <0.03 ng/mL (ref <0.03)

## 2017-11-15 MED ORDER — TRAMADOL HCL 50 MG PO TABS: ORAL_TABLET | ORAL | 0 refills | Status: AC

## 2017-11-15 MED ORDER — FENTANYL 12 MCG/HR TD PT72: 12.5000 ug | MEDICATED_PATCH | TRANSDERMAL | 0 refills | Status: DC

## 2017-11-15 MED ORDER — DEXAMETHASONE SODIUM PHOSPHATE 10 MG/ML IJ SOLN
10.0000 mg | Freq: Once | INTRAMUSCULAR | Status: AC
  Administered 2017-11-15: 10 mg via INTRAVENOUS
  Filled 2017-11-15: qty 1

## 2017-11-15 MED ORDER — FENTANYL 12 MCG/HR TD PT72: 12.5000 ug | MEDICATED_PATCH | TRANSDERMAL | Status: DC

## 2017-11-15 NOTE — ED Notes (Signed)
Pt took hydrocodone for the first time today for a herniated disc in her back that she was set to have surgery for next week. After taking the pill, pt feel dizzy and had a feeling of "doom" come over her. Pt then went to [facility] to be seen and was sent her to be evaluated. No airway involvement noted, vitals WNL, NAD.

## 2017-11-15 NOTE — Discharge Instructions (Addendum)
As we discussed, your work-up was reassuring today, but it is possible that your symptoms were the result of an adverse reaction to the hydrocodone that you were prescribed in anticipation of your back surgery.  Please try taking the Tramadol as prescribed, which should not cross-react if in fact you had an adverse reaction to the hydrocodone earlier today.  You can take Tylenol 1000 mg by mouth every 6 hours.    Return to the emergency department if you develop new or worsening symptoms that concern you.

## 2017-11-15 NOTE — ED Triage Notes (Signed)
Pt arrives from [facility] for a posisble allergic reaction. 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 That is NORMAL per pt. States numbness across top lip. Took hydrocodone about an hour ago, first time taking it per pt. States "I had this feeling of doom wash over me and then I had tingling in my arms and legs." c/o dizzy, heavy arms.   Alert, oriented, speaking in complete sentences. No rash noted on body. Denies throat swelling.

## 2017-11-15 NOTE — ED Provider Notes (Signed)
[facility] Emergency Department Provider Note  ____________________________________________   First MD Initiated Contact with Patient 11/15/17 1716     (approximate)  I have reviewed the triage vital signs and the nursing notes.   HISTORY  Chief Complaint Allergic Reaction    HPI Brittany Duran is a 67 y.o. female with no contributory past medical history who presents for evaluation of a variety of complaints that she believes is secondary to taking a Norco for back pain which was just recently prescribed.  She does not remember having taken this kind of medication in the past.  She reports that she is having severe low back pain and has been told that she has 3 protruding disks and she is scheduled to have back surgery next week.  She is not allowed to take NSAIDs as a result and she was prescribed Norco.  She took her first on this afternoon and reports that within about 15 minutes she had a "sense of impending doom", shortness of breath, and tingling throughout her body.  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  She reports that she has an EpiPen at home due to her food allergies and she "monitored the situation" and did not have to use the EpiPen but given the severity of her reaction and the acute onset she felt she should come to the emergency department for further evaluation.  She feels better now except that she does feel little bit dizzy.  She denies chest pain, nausea, vomiting, abdominal pain, and diarrhea.  Nothing in particular made the symptoms better but they did get better over time.  Nothing made them worse.  She is feeling better now but not completely asymptomatic.  She has no numbness or tingling in her extremities at this time.  She does not regularly take opioids and thinks that she may have taken some pain medicine in the past but it has been a while.  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  She has undergone a week intubation in the past and has to be given special care and consideration by anesthesiology under the circumstances.  As a result, she is particularly cautious when it comes to things that may compromise her airway.  Past Medical History:  Diagnosis Date  . Cancer ([place])   . Hypertension     There are no active problems to display for this patient.   Past Surgical History:  Procedure Laterality Date  . BREAST SURGERY    . MASTECTOMY      Prior to Admission medications   Medication Sig Start Date End Date Taking? Authorizing Provider  ALPRAZolam Duanne Moron) 0.5 MG tablet  01/31/13   [provider]  anastrozole (ARIMIDEX) 1 MG tablet  04/02/13   [provider]  benzonatate (TESSALON PERLES) 100 MG capsule Take 1 capsule (100 mg total) by mouth 3 (three) times daily as needed for cough. 09/24/15   Marylene Land, NP  ciprofloxacin (CIPRO) 500 MG tablet Take 1 tablet (500 mg total) by mouth 2 (two) times daily. 09/24/15   Marylene Land, NP  Dexlansoprazole (DEXILANT PO) Take by mouth daily.    [provider]  losartan-hydrochlorothiazide Konrad Penta) 100-12.5 MG per tablet  04/02/13   [provider]  meloxicam [facility])  15 MG tablet Take 1 tablet (15 mg total) by mouth daily. 07/22/15   Frederich Cha, MD  metaxalone (SKELAXIN) 800 MG tablet Take 1 tablet (800 mg total) by mouth 3 (three) times daily as needed for muscle spasms. 07/22/15   Frederich Cha, MD  predniSONE (DELTASONE) 10 MG tablet Start 60 mg po day one, then 50 mg po day two, taper by 10 mg daily until complete. 09/24/15   Marylene Land, NP  traMADol Veatrice Bourbon) 50 MG tablet Take 1-2 tablets by mouth every 6 hours as needed for moderate to severe pain 11/15/17   Hinda Kehr, MD  zolpidem Lorrin Mais) 10 MG tablet  03/24/13    [provider]    Allergies Macadamia nut oil and Clindamycin/lincomycin  History reviewed. No pertinent family history.  Social History Social History   Tobacco Use  . Smoking status: Never Smoker  . Smokeless tobacco: Never Used  Substance Use Topics  . Alcohol use: Yes    Comment: glass of wine   . Drug use: No    Review of Systems Constitutional: No fever/chills.  Sense of impending doom shortly after taking medication Eyes: No visual changes. ENT: No sore throat. Cardiovascular: Denies chest pain. Respiratory: +shortness of breath, now resolved Gastrointestinal: No abdominal pain.  No nausea, no vomiting.  No diarrhea.  No constipation. Genitourinary: Negative for dysuria. Musculoskeletal: Negative for neck pain.  Negative for back pain. Integumentary: Negative for rash. Neurological: No focal numbness nor weakness.  Some dizziness and headache.   ____________________________________________   PHYSICAL EXAM:  VITAL SIGNS: ED Triage Vitals  Enc Vitals Group     BP 11/15/17 1623 (!) 157/81     Pulse Rate 11/15/17 1623 (!) 102     Resp 11/15/17 1623 14     Temp 11/15/17 1623 98.6 F (37 C)     Temp Source 11/15/17 1623 Oral     SpO2 11/15/17 1623 100 %     Weight --      Height --      Head Circumference --      Peak Flow --      Pain Score 11/15/17 1626 6     Pain Loc --      Pain Edu? --      Excl. in [place]? --    Constitutional: Alert and oriented. Well appearing and in no acute distress. Eyes: Conjunctivae are normal.  Head: Atraumatic. Nose: No congestion/rhinnorhea. Mouth/Throat: Mucous membranes are moist and nonerythematous. Patient has a deformity of the left side of face which keeps her from being able to open her mouth fully, but she reports it is at baseline and she has no sensation of swelling or difficulty breathing at this time. Neck: No stridor.  No meningeal signs.   Cardiovascular: Normal rate, regular rhythm. Good peripheral  circulation. Grossly normal heart sounds. Respiratory: Normal respiratory effort.  No retractions. Lungs CTAB. Gastrointestinal: Soft and nontender. No distention.  Musculoskeletal: No lower extremity tenderness nor edema. No gross deformities of extremities. Neurologic:  Normal speech and language. No gross focal neurologic deficits are appreciated.  Skin:  Skin is warm, dry and intact. No rash noted. Psychiatric: Mood and affect are normal. Speech and behavior are normal.  ____________________________________________   LABS (all labs ordered are listed, but only abnormal results are displayed)  Labs Reviewed  CBC WITH DIFFERENTIAL/PLATELET  COMPREHENSIVE METABOLIC PANEL  TROPONIN I   ____________________________________________  EKG  ED ECG REPORT I, Hinda Kehr, the attending physician, personally viewed and  interpreted this ECG.  Date: 11/15/2017 EKG Time: 16: 32 Rate: 100 Rhythm: Mild sinus tachycardia QRS Axis: normal Intervals: normal ST/T Wave abnormalities: Non-specific ST segment / T-wave changes, but no evidence of acute ischemia. Narrative Interpretation: no evidence of acute ischemia   ____________________________________________  RADIOLOGY   ED MD interpretation:  No indication for imaging  Official radiology report(s): No results found.  ____________________________________________   PROCEDURES  Critical Care performed: No   Procedure(s) performed:   Procedures   ____________________________________________   INITIAL IMPRESSION / ASSESSMENT AND PLAN / ED COURSE  As part of my medical decision making, I reviewed the following data within the [place] notes reviewed and incorporated, Labs reviewed , EKG interpreted , Old chart reviewed and Notes from prior ED visits    Differential diagnosis includes, but is not limited to, adverse reaction to medication, ACS, electrolyte abnormality, acute infection, panic  attack.  The patient does not have any new neurological symptoms and this does not seem to be related to her chronic back issues.  She feels that is consistent with an adverse or allergic reaction and I have no way to prove or disprove this other than to say that she does not have any evidence of anaphylaxis at this time and did not require any intervention.  I check basic blood work and an EKG to see if there was any evidence that would point towards an alternative diagnosis and her results are all reassuring.  She was observed for hours in the emergency department and reports that she feels much better and is currently asymptomatic.  Initially I was going to give her an alternative medication of fentanyl patches at the lowest possible dose of 12.5 mcg as an alternative to the [place], but the pharmacist recommended against this since the fentanyl patches are usually used for opioid tolerant patients.  As an alternative I provided a tramadol prescription and encourage the patient to follow-up with her doctor at the next available opportunity.  I gave strict return precautions and she understands and agrees with the plan.  I also added an FYI within [facility] regarding her difficult airway.  She understands and agrees with plan.  Clinical Course as of Nov 16 2307  Thu Nov 15, 2017  2008 I reviewed the patient's prescription history over the last 24 months in the multi-state controlled substances database(s) that includes [place], [place], [place], [place], [place], [place], [place], [place], [place], [place], [place], [place], [place], [place], and [place].  Results were notable for some chronic alprazolam prescriptions and some narcotics there were prescribed about 9 months ago, no other recent prescriptions are listed in the patient is at low risk for abuse based on this report.   [CF]    Clinical Course User Index [CF] Hinda Kehr, MD     ____________________________________________  FINAL CLINICAL IMPRESSION(S) / ED DIAGNOSES  Final diagnoses:  Allergic reaction to drug, initial encounter  Chronic midline low back pain with sciatica, sciatica laterality unspecified     MEDICATIONS GIVEN DURING THIS VISIT:  Medications  dexamethasone (DECADRON) injection 10 mg (10 mg Intravenous Given 11/15/17 1803)     ED Discharge Orders        Ordered    fentaNYL (DURAGESIC - DOSED MCG/HR) 12 MCG/HR  every 72 hours,   Status:  Discontinued     11/15/17 2007    traMADol (ULTRAM) 50 MG tablet     11/15/17 2041  Note:  This document was prepared using Dragon voice recognition software and may include unintentional dictation errors.    Hinda Kehr, MD 11/15/17 [phone]

## 2018-01-12 ENCOUNTER — Encounter: Payer: Self-pay | Admitting: Emergency Medicine

## 2018-01-12 DIAGNOSIS — Z79899 Other long term (current) drug therapy: Secondary | ICD-10-CM | POA: Insufficient documentation

## 2018-01-12 DIAGNOSIS — Y69 Unspecified misadventure during surgical and medical care: Secondary | ICD-10-CM | POA: Insufficient documentation

## 2018-01-12 DIAGNOSIS — T82898A Other specified complication of vascular prosthetic devices, implants and grafts, initial encounter: Secondary | ICD-10-CM | POA: Insufficient documentation

## 2018-01-12 DIAGNOSIS — I10 Essential (primary) hypertension: Secondary | ICD-10-CM | POA: Diagnosis not present

## 2018-01-12 DIAGNOSIS — Z853 Personal history of malignant neoplasm of breast: Secondary | ICD-10-CM | POA: Diagnosis not present

## 2018-01-12 DIAGNOSIS — Z452 Encounter for adjustment and management of vascular access device: Secondary | ICD-10-CM | POA: Diagnosis present

## 2018-01-12 MED ORDER — SENNOSIDES-DOCUSATE SODIUM 8.6-50 MG PO TABS
2.00 | ORAL_TABLET | ORAL | Status: DC
Start: 2018-01-12 — End: 2018-01-12

## 2018-01-12 MED ORDER — ASPIRIN EC 81 MG PO TBEC
81.00 | DELAYED_RELEASE_TABLET | ORAL | Status: DC
Start: 2018-01-13 — End: 2018-01-12

## 2018-01-12 MED ORDER — SODIUM CHLORIDE FLUSH 0.9 % IV SOLN
10.00 | INTRAVENOUS | Status: DC
Start: 2018-01-12 — End: 2018-01-12

## 2018-01-12 MED ORDER — GENERIC EXTERNAL MEDICATION
1.50 | Status: DC
Start: 2018-01-12 — End: 2018-01-12

## 2018-01-12 MED ORDER — LIDOCAINE HCL (PF) 1 % IJ SOLN
0.50 | INTRAMUSCULAR | Status: DC
Start: ? — End: 2018-01-12

## 2018-01-12 MED ORDER — LOSARTAN POTASSIUM 25 MG PO TABS
25.00 | ORAL_TABLET | ORAL | Status: DC
Start: 2018-01-13 — End: 2018-01-12

## 2018-01-12 MED ORDER — POLYETHYLENE GLYCOL 3350 17 G PO PACK
17.00 | PACK | ORAL | Status: DC
Start: 2018-01-13 — End: 2018-01-12

## 2018-01-12 MED ORDER — OXYCODONE HCL 5 MG PO TABS
5.00 | ORAL_TABLET | ORAL | Status: DC
Start: ? — End: 2018-01-12

## 2018-01-12 MED ORDER — TRAZODONE HCL 100 MG PO TABS
100.00 | ORAL_TABLET | ORAL | Status: DC
Start: ? — End: 2018-01-12

## 2018-01-12 MED ORDER — ANASTROZOLE 1 MG PO TABS
1.00 | ORAL_TABLET | ORAL | Status: DC
Start: 2018-01-13 — End: 2018-01-12

## 2018-01-12 MED ORDER — ONDANSETRON HCL 4 MG/2ML IJ SOLN
4.00 | INTRAMUSCULAR | Status: DC
Start: ? — End: 2018-01-12

## 2018-01-12 MED ORDER — GENERIC EXTERNAL MEDICATION
Status: DC
Start: ? — End: 2018-01-12

## 2018-01-12 MED ORDER — ACETAMINOPHEN 325 MG PO TABS
975.00 | ORAL_TABLET | ORAL | Status: DC
Start: ? — End: 2018-01-12

## 2018-01-12 MED ORDER — GABAPENTIN 300 MG PO CAPS
300.00 | ORAL_CAPSULE | ORAL | Status: DC
Start: 2018-01-12 — End: 2018-01-12

## 2018-01-12 MED ORDER — ALPRAZOLAM 0.5 MG PO TABS
0.50 | ORAL_TABLET | ORAL | Status: DC
Start: ? — End: 2018-01-12

## 2018-01-12 MED ORDER — ENOXAPARIN SODIUM 40 MG/0.4ML ~~LOC~~ SOLN
40.00 | SUBCUTANEOUS | Status: DC
Start: 2018-01-13 — End: 2018-01-12

## 2018-01-12 MED ORDER — SODIUM CHLORIDE FLUSH 0.9 % IV SOLN
20.00 | INTRAVENOUS | Status: DC
Start: ? — End: 2018-01-12

## 2018-01-12 MED ORDER — SODIUM CHLORIDE FLUSH 0.9 % IV SOLN
5.00 | INTRAVENOUS | Status: DC
Start: 2018-01-12 — End: 2018-01-12

## 2018-01-12 NOTE — ED Triage Notes (Signed)
Pt to ED tonight due to pic-line obstruction and inability to flush line or receive home infusions of vancomycin. Pt seen at [facility] to day at 1330 and had antibiotic infusion without issue.

## 2018-01-13 ENCOUNTER — Emergency Department
Admission: EM | Admit: 2018-01-13 | Discharge: 2018-01-13 | Disposition: A | Discharge status: Home / Self Care | Payer: Medicare HMO | Attending: Emergency Medicine | Admitting: Emergency Medicine

## 2018-01-13 DIAGNOSIS — T82898A Other specified complication of vascular prosthetic devices, implants and grafts, initial encounter: Secondary | ICD-10-CM

## 2018-01-13 MED ORDER — ALTEPLASE 2 MG IJ SOLR
2.0000 mg | Freq: Once | INTRAMUSCULAR | Status: AC
  Administered 2018-01-13: 2 mg
  Filled 2018-01-13: qty 2

## 2018-01-13 NOTE — ED Provider Notes (Signed)
[facility] Emergency Department Provider Note  ____________________________________________   First MD Initiated Contact with Patient 01/13/18 0024     (approximate)  I have reviewed the triage vital signs and the nursing notes.   HISTORY  Chief Complaint Vascular Access Problem   HPI Brittany Duran is a 67 y.o. female who self presents to the emergency department with a malfunctioning PICC line.  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  She was discharged from [facility] about 12 hours ago with a functioning PICC line however when she tried to access at this evening she was unable to get it to draw back and she noted a small amount of blood in the tubing which concerned her.  She is had no fevers or chills.  Her symptoms came on suddenly.  Nothing seems to make them better or worse.  She is currently in no pain.    Past Medical History:  Diagnosis Date  . Cancer ([place])   . Hypertension     There are no active problems to display for this patient.   Past Surgical History:  Procedure Laterality Date  . BREAST SURGERY    . MASTECTOMY      Prior to Admission medications   Medication Sig Start Date End Date Taking? Authorizing Provider  ALPRAZolam Duanne Moron) 0.5 MG tablet  01/31/13   [provider]  anastrozole (ARIMIDEX) 1 MG tablet  04/02/13   [provider]  benzonatate (TESSALON PERLES) 100 MG capsule Take 1 capsule (100 mg total) by mouth 3 (three) times daily as needed for cough. 09/24/15   Marylene Land, NP  ciprofloxacin (CIPRO) 500 MG tablet Take 1 tablet (500 mg total) by mouth 2 (two) times daily. 09/24/15   Marylene Land, NP  Dexlansoprazole (DEXILANT PO) Take by mouth daily.    [provider]  losartan-hydrochlorothiazide Konrad Penta) 100-12.5 MG per tablet  04/02/13   [provider]  meloxicam (MOBIC) 15 MG tablet  Take 1 tablet (15 mg total) by mouth daily. 07/22/15   Frederich Cha, MD  metaxalone (SKELAXIN) 800 MG tablet Take 1 tablet (800 mg total) by mouth 3 (three) times daily as needed for muscle spasms. 07/22/15   Frederich Cha, MD  predniSONE (DELTASONE) 10 MG tablet Start 60 mg po day one, then 50 mg po day two, taper by 10 mg daily until complete. 09/24/15   Marylene Land, NP  traMADol Veatrice Bourbon) 50 MG tablet Take 1-2 tablets by mouth every 6 hours as needed for moderate to severe pain 11/15/17   Hinda Kehr, MD  zolpidem Lorrin Mais) 10 MG tablet  03/24/13   [provider]    Allergies Macadamia nut oil and Clindamycin/lincomycin  History reviewed. No pertinent family history.  Social History Social History   Tobacco Use  . Smoking status: Never Smoker  . Smokeless tobacco: Never Used  Substance Use Topics  . Alcohol use: Yes    Comment: glass of wine   . Drug use: No    Review of Systems Constitutional: No fever/chills Cardiovascular: Denies chest pain. Respiratory: Denies shortness of breath. Gastrointestinal: No abdominal pain.  No nausea, no vomiting.   Genitourinary: Negative for dysuria. Musculoskeletal: Negative for back pain. Skin: Negative for rash. Neurological: Negative for headaches, focal weakness or numbness.   ____________________________________________   PHYSICAL EXAM:  VITAL SIGNS: ED Triage Vitals [01/12/18 2252]  Enc Vitals Group     BP Marland Kitchen)  169/90     Pulse Rate 91     Resp 17     Temp 98.6 F (37 C)     Temp Source Oral     SpO2 98 %     Weight      Height      Head Circumference      Peak Flow      Pain Score      Pain Loc      Pain Edu?      Excl. in [place]?     Constitutional: Alert and oriented x4 joking laughing pleasant cooperative speaks full clear sentences no diaphoresis Cardiovascular: Normal rate, regular rhythm. Grossly normal heart sounds.  Good peripheral circulation. Respiratory: Normal respiratory effort.  No  retractions. Lungs CTAB and moving good air Musculoskeletal: No lower extremity edema   Neurologic:  Normal speech and language. No gross focal neurologic deficits are appreciated. Skin:  Skin is warm, dry and intact. No rash noted. Psychiatric: Mood and affect are normal. Speech and behavior are normal.    ____________________________________________   DIFFERENTIAL includes but not limited to  Clogged PICC line, PICC malfunction, bacteremia ____________________________________________   LABS (all labs ordered are listed, but only abnormal results are displayed)  Labs Reviewed - No data to display   __________________________________________  EKG   ____________________________________________  RADIOLOGY   ____________________________________________   PROCEDURES  Procedure(s) performed: no  Procedures  Critical Care performed: no  ____________________________________________   INITIAL IMPRESSION / ASSESSMENT AND PLAN / ED COURSE  Pertinent labs & imaging results that were available during my care of the patient were reviewed by me and considered in my medical decision making (see chart for details).   As part of my medical decision making, I reviewed the following data within the [place] History obtained from family if available, nursing notes, old chart and ekg, as well as notes from prior ED visits.  I attempted to access the patient's PICC line in usual sterile fashion and was unable to withdraw any blood.  I closed off the line and have ordered alteplase to infuse.    ----------------------------------------- 1:39 AM on 01/13/2018 -----------------------------------------  I just infused to the [facility] now.  We will wait 30 minutes. ____________________________________________   ----------------------------------------- 2:15 AM on 01/13/2018 -----------------------------------------  After 30 minutes was able to aspirate blood  from the PICC line.  I aspirated a total of 10 cc to ensure that no alteplase was left in the line.  I then flushed it easily and replaced the pigtail.  Patient is discharged home in improved condition.  I offered her a dose of vancomycin now however she said she would prefer to go home and give her home dose which I think is reasonable.  FINAL CLINICAL IMPRESSION(S) / ED DIAGNOSES  Final diagnoses:  Occluded PICC line, initial encounter [facility])      NEW MEDICATIONS STARTED DURING THIS VISIT:  Discharge Medication List as of 01/13/2018  2:16 AM       Note:  This document was prepared using Dragon voice recognition software and may include unintentional dictation errors.     Darel Hong, MD 01/13/18 2148

## 2018-01-13 NOTE — Discharge Instructions (Signed)
Please continue your home infusions as scheduled and return to the emergency department for any concerns.  It was a pleasure to take care of you today, and thank you for coming to our emergency department.  If you have any questions or concerns before leaving please ask the nurse to grab me and I'm more than happy to go through your aftercare instructions again.  If you were prescribed any opioid pain medication today such as Norco, Vicodin, Percocet, morphine, hydrocodone, or oxycodone please make sure you do not drive when you are taking this medication as it can alter your ability to drive safely.  If you have any concerns once you are home that you are not improving or are in fact getting worse before you can make it to your follow-up appointment, please do not hesitate to call 911 and come back for further evaluation.  Darel Hong, MD

## 2018-04-08 ENCOUNTER — Other Ambulatory Visit: Payer: Self-pay

## 2018-04-08 ENCOUNTER — Ambulatory Visit: Payer: Medicare HMO | Attending: Family Medicine | Admitting: Physical Therapy

## 2018-04-08 ENCOUNTER — Encounter: Payer: Self-pay | Admitting: Physical Therapy

## 2018-04-08 DIAGNOSIS — R2681 Unsteadiness on feet: Secondary | ICD-10-CM

## 2018-04-08 DIAGNOSIS — M6281 Muscle weakness (generalized): Secondary | ICD-10-CM | POA: Insufficient documentation

## 2018-04-08 DIAGNOSIS — R262 Difficulty in walking, not elsewhere classified: Secondary | ICD-10-CM | POA: Diagnosis present

## 2018-04-08 NOTE — Therapy (Signed)
[place] MAIN [facility] SERVICES [address] [place], [place], [address] Phone: [phone]   Fax:  [phone]  Physical Therapy Evaluation  Patient Details  Name: Brittany Duran MRN: [id number] Date of Birth: [birth year] Referring Provider (PT): Tawni Carnes, MD/ Elayne Guerin, MD   Encounter Date: 04/08/2018  PT End of Session - 04/08/18 1254    Visit Number  1    Number of Visits  7    Date for PT Re-Evaluation  05/20/18    Authorization Type  1/10, start of care: 04/08/18    PT Start Time  1030    PT Stop Time  1132    PT Time Calculation (min)  62 min    Equipment Utilized During Treatment  Gait belt    Activity Tolerance  Patient tolerated treatment well;No increased pain    Behavior During Therapy  WFL for tasks assessed/performed       Past Medical History:  Diagnosis Date  . Cancer ([place])   . Hypertension     Past Surgical History:  Procedure Laterality Date  . BREAST SURGERY    . MASTECTOMY      There were no vitals filed for this visit.   Subjective Assessment - 04/08/18 1033    Subjective  "I want someone to help me learn to walk again without the walker. I use the walker as a crutch due to fear of falling."     Pertinent History  67 yo Female reports increased weakness in BLE; Patient had lumbar discectomy surgery in July 2019; Upon waking, patient was unable to move legs and was in the hospital for a week; She was discharged to short term rehab and came down with MRSA; patient underwent a second surgery in August 2019 at [facility]; She has had home health PT; Patient is now being referred to outpatient PT for weakness; She continues to have numbness in BLE to feet; She is ambulating with RW all the time; Patient reports having impaired proprioception and impaired gait mechanics; She reports recent falls due to LLE buckling (last fall was end of Oct 2019); She reports that now she is having better quad control  with less buckling;     How long can you sit comfortably?  30 min;     How long can you stand comfortably?  15-20 min;     How long can you walk comfortably?  40 min;     Diagnostic tests  X-rays shows large disc bulge at L4-L5    Patient Stated Goals  "I want to be able to walk better and get stronger" reduce fear of falling;     Currently in Pain?  Yes    Pain Score  4     Pain Location  Back    Pain Orientation  Left    Pain Descriptors / Indicators  Aching    Pain Type  Chronic pain    Pain Onset  More than a month ago    Pain Frequency  Constant    Aggravating Factors   prolonged sitting/standing;     Pain Relieving Factors  heat/cold; lying down with legs elevated    Effect of Pain on Daily Activities  decreased activity tolerance;     Multiple Pain Sites  No         [facility] PT Assessment - 04/08/18 0001      Assessment   Medical Diagnosis  LE weakness    Referring Provider (PT)  Tawni Carnes, MD/ Elayne Guerin, MD    Onset Date/Surgical Date  --   July 2019   Hand Dominance  Right    Next MD Visit  04/18/18    Prior Therapy  had 2 weeks of outpatient PT, but stopped due to lack of progression of exercise      Precautions   Precautions  None      Restrictions   Weight Bearing Restrictions  No      Balance Screen   Has the patient fallen in the past 6 months  Yes    How many times?  2    Has the patient had a decrease in activity level because of a fear of falling?   Yes    Is the patient reluctant to leave their home because of a fear of falling?   No      Home Environment   Additional Comments  Lives in 2 story home, but all living on main floor, has 1 step to enter house; husband helps some with shower (Min A), husband does most cooking/cleaning;       Prior Function   Level of Independence  Independent;Independent with gait    Vocation  Retired    [employer]  Retired Therapist, sports    Leisure  was exercising 2-3 hours a day prior to initial  surgery; wants to get back to yoga, driving, shop;       Cognition   Overall Cognitive Status  Within Functional Limits for tasks assessed      Observation/Other Assessments   Observations  very pleasant female; in standing: uneven hip height      Sensation   Light Touch  Impaired by gross assessment    Hot/Cold  Appears Intact      Coordination   Gross Motor Movements are Fluid and Coordinated  Yes      Posture/Postural Control   Posture Comments  in standing, able to exhibit erect posture, uneven hip height noted with scoliotic curve in lumbosacral spine;       AROM   Overall AROM Comments  BUE and BLE are [facility]    Lumbar Flexion  25    Lumbar Extension  10    Lumbar - Right Side Bend  10    Lumbar - Left Side Bend  10      Strength   Right Hip Flexion  3+/5    Right Hip Extension  4-/5    Right Hip ABduction  4-/5    Left Hip Flexion  3-/5    Left Hip Extension  3+/5    Left Hip ABduction  4-/5    Left Hip ADduction  2/5    Right Knee Flexion  4/5    Right Knee Extension  4/5    Left Knee Flexion  3-/5    Left Knee Extension  3/5    Right/Left Ankle  Right;Left    Right Ankle Dorsiflexion  4/5    Left Ankle Dorsiflexion  4/5      Palpation   Spinal mobility  hypomobility noted throughout lumbar with PA mobs, pain reported at L2-L4 levels    SI assessment   moderate tenderness to PSIS bilaterally; good mobility noted;     Palpation comment  exhibits good tone in bilateral multifidi;       FABER test   findings  Negative    Side  --   bilaterally     Straight Leg Raise   Findings  Negative    Side   --   bilaterally     Transfers   Comments  able to transfer sit<>Stand without pushing on chair, but pushes on thigh;       Ambulation/Gait   Gait Comments  ambulates with RW, good reciprocal gait pattern, slower gait speed,good erect posture, good foot clearance;      6 Minute Walk- Baseline   6 Minute Walk- Baseline  yes    BP (mmHg)  144/69    HR (bpm)   80    02 Sat (%RA)  100 %      6 Minute walk- Post Test   6 Minute Walk Post Test  yes    BP (mmHg)  143/61    HR (bpm)  85    Modified Borg Scale for Dyspnea  3- Moderate shortness of breath or breathing difficulty      6 minute walk test results    Aerobic Endurance Distance Walked  885    Endurance additional comments  <1000 feet is limited community ambulator, less than age group norms >1700 feet      Berg Balance Test   Sit to Stand  Able to stand  independently using hands    Standing Unsupported  Able to stand 2 minutes with supervision    Sitting with Back Unsupported but Feet Supported on Floor or Stool  Able to sit safely and securely 2 minutes    Stand to Sit  Controls descent by using hands    Transfers  Able to transfer safely, definite need of hands    Standing Unsupported with Eyes Closed  Able to stand 10 seconds with supervision    Standing Ubsupported with Feet Together  Able to place feet together independently and stand for 1 minute with supervision    From Standing, Reach Forward with Outstretched Arm  Can reach forward >12 cm safely (5")    From Standing Position, Pick up Object from Floor  Unable to pick up and needs supervision    From Standing Position, Turn to Look Behind Over each Shoulder  Turn sideways only but maintains balance    Turn 360 Degrees  Needs assistance while turning    Standing Unsupported, Alternately Place Feet on Step/Stool  Able to complete >2 steps/needs minimal assist    Standing Unsupported, One Foot in Front  Able to take small step independently and hold 30 seconds    Standing on One Leg  Tries to lift leg/unable to hold 3 seconds but remains standing independently    Total Score  32    Berg comment:  <36/56 indicates 100%risk for falls                Objective measurements completed on examination: See above findings.  Will address HEP next visit            PT Education - 04/08/18 1254    Education Details   recommendations/plan of care    Person(s) Educated  Patient    Methods  Explanation;Verbal cues    Comprehension  Verbalized understanding;Returned demonstration;Verbal cues required;Need further instruction       PT Short Term Goals - 04/08/18 1454      PT SHORT TERM GOAL #1   Title  Patient will be adherent to HEP at least 3x a week to improve functional strength and balance for better safety at home.    Time  2    Period  Weeks    Status  New    Target Date  04/22/18      PT SHORT TERM GOAL #2   Title  Patient will improve lumbar AROM: flexion >30 degrees, lateral flexion >15 degrees bilaterally to improve functional ROM when picking up items from floor;     Time  2    Period  Weeks    Status  New    Target Date  04/22/18        PT Long Term Goals - 04/08/18 1455      PT LONG TERM GOAL #1   Title  Patient (> 78 years old) will complete five times sit to stand test in < 15 seconds indicating an increased LE strength and improved balance.    Time  6    Period  Weeks    Status  New    Target Date  05/20/18      PT LONG TERM GOAL #2   Title  Patient will increase Berg Balance score by > 6 points to demonstrate decreased fall risk during functional activities.    Time  6    Period  Weeks    Status  New    Target Date  05/20/18      PT LONG TERM GOAL #3   Title  Patient will increase six minute walk test distance to >1000 for progression to community ambulator and improve gait ability    Time  6    Period  Weeks    Status  New    Target Date  05/20/18      PT LONG TERM GOAL #4   Title  Patient will increase BLE gross strength to 4+/5 as to improve functional strength for independent gait, increased standing tolerance and increased ADL ability.    Time  6    Period  Weeks    Status  New    Target Date  05/20/18             Plan - 04/08/18 1446    Clinical Impression Statement  67 yo Female s/p lumbar laminectomy in July 2423 with complication of MRSA  infection and s/p surgical treatment to remove infection in Aug 2019, reports increased LE weakness and impaired gait ability. Patient has had extensive inpatient and home health PT. She was referred to outpatient PT at [facility] clinic but expressed frustration over just doing same exercise as [facility] therapist and decided to stop going to therapy; Patient is requesting skilled PT intervention to improve strength, balance and gait ability; She is ambulating with RW for all distances. Patient lives with her husband who provides min A for some ADLs including showers and dressing. She exhibits impaired balance testing as a high fall risk with Berg Balance Assessment. Patient also exhibits impaired endurance walking less than community ambulator on 6 min walk test. She expressed desire to try aquatic therapy; However due to impaired proprioception in LE concerned over safety due to limited gravity and reduced proprioceptive input of water properties. Patient would benefit from skilled PT intervention to improve LE strength and stability while also improving balance and gait safety on land. Patient agreeable.     History and Personal Factors relevant to plan of care:  good PLOF with exercising 2-3 hours a day, lives with husband, has 1 step to enter home, 2 recent falls, co-morbidities include HTN;     Clinical Presentation  Evolving    Clinical Presentation due to:  high fall risk with recent falls;     Clinical  Decision Making  Moderate    Rehab Potential  Good    Clinical Impairments Affecting Rehab Potential  patient highly motivated and committed to physical fitness, already attending silver sneakers class    PT Frequency  1x / week    PT Duration  6 weeks    PT Treatment/Interventions  Cryotherapy;Electrical Stimulation;Moist Heat;Gait training;DME Instruction;Stair training;Functional mobility training;Therapeutic activities;Therapeutic exercise;Balance training;Neuromuscular re-education;Patient/family  education;Manual techniques;Energy conservation;Dry needling    PT Next Visit Plan  address [place]  will address next visit    Consulted and Agree with Plan of Care  Patient       Patient will benefit from skilled therapeutic intervention in order to improve the following deficits and impairments:  Abnormal gait, Decreased endurance, Hypomobility, Decreased activity tolerance, Decreased strength, Pain, Difficulty walking, Decreased mobility, Decreased balance, Decreased range of motion, Improper body mechanics, Decreased safety awareness  Visit Diagnosis: Muscle weakness (generalized)  Difficulty in walking, not elsewhere classified  Unsteadiness on feet     Problem List There are no active problems to display for this patient.   Makell Drohan  PT, DPT 04/08/2018, 2:57 PM  [place] MAIN [facility] SERVICES [address] [place], [place], [address] Phone: [phone]   Fax:  [phone]  Name: TEIGAN MANNER MRN: [id number] Date of Birth: [birth year]

## 2018-04-11 ENCOUNTER — Encounter: Payer: Self-pay | Admitting: Physical Therapy

## 2018-04-11 ENCOUNTER — Ambulatory Visit: Payer: Medicare HMO | Admitting: Physical Therapy

## 2018-04-11 DIAGNOSIS — M6281 Muscle weakness (generalized): Secondary | ICD-10-CM

## 2018-04-11 DIAGNOSIS — R262 Difficulty in walking, not elsewhere classified: Secondary | ICD-10-CM

## 2018-04-11 DIAGNOSIS — R2681 Unsteadiness on feet: Secondary | ICD-10-CM

## 2018-04-11 NOTE — Patient Instructions (Addendum)
  Balance, Proprioception: Hip Abduction With Tubing   With tubing attached to both ankles, Standing holding onto counter, kick one leg out to side and then Return.  Repeat _10___ times  On each side.  Do ___2_ sessions per day.  http://cc.exer.us/20    Copyright  VHI. All rights reserved.  Band Walk: Side Stepping   Tie band around legs, AROUND ANKLES. Step _10__ feet to one side, then step back to start. Repeat _2-3__ feet per session. Note: Small towel between band and skin eases rubbing.    SIT TO STAND: No Device   Sit with feet shoulder-width apart, on floor.(Make sure that you are in a chair that won't move like a chair against a wall or couch etc) Lean chest forward, raise hips up from surface. Straighten hips and knees. Weight bear equally on left and right sides. 10___ reps per set, _2__ sets per day, _5__ days per week Place left leg closer to sitting surface.  Copyright  VHI. All rights reserved.  Backward Walking   Walk backward, toes of each foot coming down first. Take long, even strides. Make sure you have a clear pathway with no obstructions when you do this. Stand beside counter and walk backward  And then walk forward doing opposite directions; repeat 10 laps 2x a day at least 5 days a week.  Copyright  VHI. All rights reserved.  Tandem Walking   Stand beside kitchen sink and place one foot in front of the other, lift your hand and try to hold position for 10 sec. Repeat with other foot in front; Repeat 5 reps with each foot in front 5 days a week.Balance: Unilateral   Attempt to balance on left leg, eyes open. Hold _5-10___ seconds.Start with holding onto counter and if you get your balance you can try to let go of counter. Repeat __5__ times per set. Do __1__ sets per session. Do __1__ sessions per day. Keep eyes open:   http://orth.exer.us/29   Copyright  VHI. All rights reserved.    Toe / Heel Raise (Standing)    Standing with support,  raise heels, then rock back on heels and raise toes. Repeat ____ times.  Copyright  VHI. All rights reserved.  http://plyo.exer.us/76

## 2018-04-11 NOTE — Therapy (Signed)
[place] MAIN [facility] SERVICES [address] [place], [place], [address] Phone: [phone]   Fax:  [phone]  Physical Therapy Treatment  Patient Details  Name: Brittany Duran MRN: [id number] Date of Birth: June 30, 1950 Referring Provider (PT): Tawni Carnes, MD/ Elayne Guerin, MD   Encounter Date: 04/11/2018  PT End of Session - 04/11/18 1110    Visit Number  2    Number of Visits  7    Date for PT Re-Evaluation  05/20/18    Authorization Type  2/10 start of care: 04/08/18    PT Start Time  1102    PT Stop Time  1145    PT Time Calculation (min)  43 min    Equipment Utilized During Treatment  Gait belt    Activity Tolerance  Patient tolerated treatment well;No increased pain    Behavior During Therapy  WFL for tasks assessed/performed       Past Medical History:  Diagnosis Date  . Cancer ([place])   . Hypertension     Past Surgical History:  Procedure Laterality Date  . BREAST SURGERY    . MASTECTOMY      There were no vitals filed for this visit.  Subjective Assessment - 04/11/18 1105    Subjective  Patient reports having some back discomfort on left side after silver sneakers class  with UE lifting; Patient reports otherwise not experiencing any back pain;     Pertinent History  67 yo Female reports increased weakness in BLE; Patient had lumbar discectomy surgery in July 2019; Upon waking, patient was unable to move legs and was in the hospital for a week; She was discharged to short term rehab and came down with MRSA; patient underwent a second surgery in August 2019 at [facility]; She has had home health PT; Patient is now being referred to outpatient PT for weakness; She continues to have numbness in BLE to feet; She is ambulating with RW all the time; Patient reports having impaired proprioception and impaired gait mechanics; She reports recent falls due to LLE buckling (last fall was end of Oct 2019); She reports that now  she is having better quad control with less buckling;     How long can you sit comfortably?  30 min;     How long can you stand comfortably?  15-20 min;     How long can you walk comfortably?  40 min;     Diagnostic tests  X-rays shows large disc bulge at L4-L5    Patient Stated Goals  "I want to be able to walk better and get stronger" reduce fear of falling;     Currently in Pain?  No/denies    Multiple Pain Sites  No         TREATMENT: Warm up on crosstrainer, level 2 x5 min with HEP instruction;  Standing with red tband around BLE: Hip abduction x10 bilaterally; Side stepping x10 feet x2 laps with BUE rail assist for balance; Patient required min-moderate verbal/tactile cues for correct exercise technique including cues to improve erect posture and increase core stabilization for less discomfort;  Standing LLE terminal knee extension green tband 2x10 with cues for positioning and sequencing for better LLE strengthening;   Leg press: BLE 90# x10 with cues to slow down LE movement and improve knee position for better quad control; LLE only 45#, 2x10 with cues for positioning to improve LLE control;   Instructed patient in balance exercise as part of HEP: -  forward/backward walking x10 feet x3 laps with 1 rail assist; Required mod VCs for correct gait mechanics including improving heel strike, increase step length and increase knee extension when walking backwards prior to shifting weight on LE; -Tandem stance 10 sec hold x2 reps each foot in front with 1-0 rail assist with mod VCs to increase core stabillization for better stance control; -SLS on firm surface 10 sec hold x2 reps each LE with 1-0 rail assist; cues for erect posture and core stabilization for improved stance control;  Sit<>Stand from regular height chair without UE assist x5 reps with min VCS to avance RLE forward for better challenge on LLE;                        PT Education - 04/11/18 1109     Education Details  HEP advanced, balance/strengthening;     Person(s) Educated  Patient    Methods  Explanation;Verbal cues    Comprehension  Verbalized understanding;Verbal cues required;Returned demonstration;Need further instruction       PT Short Term Goals - 04/08/18 1454      PT SHORT TERM GOAL #1   Title  Patient will be adherent to HEP at least 3x a week to improve functional strength and balance for better safety at home.    Time  2    Period  Weeks    Status  New    Target Date  04/22/18      PT SHORT TERM GOAL #2   Title  Patient will improve lumbar AROM: flexion >30 degrees, lateral flexion >15 degrees bilaterally to improve functional ROM when picking up items from floor;     Time  2    Period  Weeks    Status  New    Target Date  04/22/18        PT Long Term Goals - 04/08/18 1455      PT LONG TERM GOAL #1   Title  Patient (> 75 years old) will complete five times sit to stand test in < 15 seconds indicating an increased LE strength and improved balance.    Time  6    Period  Weeks    Status  New    Target Date  05/20/18      PT LONG TERM GOAL #2   Title  Patient will increase Berg Balance score by > 6 points to demonstrate decreased fall risk during functional activities.    Time  6    Period  Weeks    Status  New    Target Date  05/20/18      PT LONG TERM GOAL #3   Title  Patient will increase six minute walk test distance to >1000 for progression to community ambulator and improve gait ability    Time  6    Period  Weeks    Status  New    Target Date  05/20/18      PT LONG TERM GOAL #4   Title  Patient will increase BLE gross strength to 4+/5 as to improve functional strength for independent gait, increased standing tolerance and increased ADL ability.    Time  6    Period  Weeks    Status  New    Target Date  05/20/18            Plan - 04/11/18 1139    Clinical Impression Statement  Patient instructed in advanced HEP with  strengthening and  balance exercise. She was able to demonstrate good safety awareness with exercise. She does require cues for correct positioning and body mechanics. Patient responded well to cues with better postural control and better stabilization; She does have significant weakness in LLE with poor motor control, particularly in knee. Instructed patient in terminal knee extension and single leg, leg press for better control; She reports mild fatigue at end of session but denies any increase in pain; She would benefit from additional skilled PT intervention to improve strength, balance and gait safety;     Rehab Potential  Good    Clinical Impairments Affecting Rehab Potential  patient highly motivated and committed to physical fitness, already attending silver sneakers class    PT Frequency  1x / week    PT Duration  6 weeks    PT Treatment/Interventions  Cryotherapy;Electrical Stimulation;Moist Heat;Gait training;DME Instruction;Stair training;Functional mobility training;Therapeutic activities;Therapeutic exercise;Balance training;Neuromuscular re-education;Patient/family education;Manual techniques;Energy conservation;Dry needling    PT Next Visit Plan  address [place]  will address next visit    Consulted and Agree with Plan of Care  Patient       Patient will benefit from skilled therapeutic intervention in order to improve the following deficits and impairments:  Abnormal gait, Decreased endurance, Hypomobility, Decreased activity tolerance, Decreased strength, Pain, Difficulty walking, Decreased mobility, Decreased balance, Decreased range of motion, Improper body mechanics, Decreased safety awareness  Visit Diagnosis: Muscle weakness (generalized)  Difficulty in walking, not elsewhere classified  Unsteadiness on feet     Problem List There are no active problems to display for this patient.   Junie Engram PT, DPT 04/11/2018, 12:45 PM  [place] MAIN [facility] SERVICES [address] [place], [place], [address] Phone: [phone]   Fax:  [phone]  Name: MADDY GRAHAM MRN: [id number] Date of Birth: 08-17-1950

## 2018-04-15 ENCOUNTER — Ambulatory Visit: Payer: Medicare HMO

## 2018-04-16 ENCOUNTER — Ambulatory Visit: Payer: Medicare Other

## 2018-04-17 ENCOUNTER — Ambulatory Visit: Payer: Medicare HMO

## 2018-04-17 VITALS — BP 144/70 | HR 74

## 2018-04-17 DIAGNOSIS — R2681 Unsteadiness on feet: Secondary | ICD-10-CM

## 2018-04-17 DIAGNOSIS — R262 Difficulty in walking, not elsewhere classified: Secondary | ICD-10-CM

## 2018-04-17 DIAGNOSIS — M6281 Muscle weakness (generalized): Secondary | ICD-10-CM

## 2018-04-17 NOTE — Therapy (Signed)
[place] MAIN [facility] SERVICES [address] [place], [place], [address] Phone: [phone]   Fax:  [phone]  Physical Therapy Treatment  Patient Details  Name: Brittany Duran MRN: [id number] Date of Birth: [birth year] Referring Provider (PT): Tawni Carnes, MD/ Elayne Guerin, MD   Encounter Date: 04/17/2018  PT End of Session - 04/17/18 0856    Visit Number  3    Number of Visits  7    Date for PT Re-Evaluation  05/20/18    Authorization Type  3/10 start of care: 04/08/18    PT Start Time  0900    PT Stop Time  0945    PT Time Calculation (min)  45 min    Equipment Utilized During Treatment  Gait belt    Activity Tolerance  Patient tolerated treatment well;No increased pain    Behavior During Therapy  WFL for tasks assessed/performed       Past Medical History:  Diagnosis Date  . Cancer ([place])   . Hypertension     Past Surgical History:  Procedure Laterality Date  . BREAST SURGERY    . MASTECTOMY      Vitals:   04/17/18 0905  BP: (!) 144/70  Pulse: 74  SpO2: 100%    Subjective Assessment - 04/17/18 0856    Subjective  Pt reports doing well upon arrival. No falls since last visit. No changes in health. No specific questions or concerns. She has a follow-up appointment with her neurosurgeon tomorrow.     Pertinent History  67 yo Female reports increased weakness in BLE; Patient had lumbar discectomy surgery in July 2019; Upon waking, patient was unable to move legs and was in the hospital for a week; She was discharged to short term rehab and came down with MRSA; patient underwent a second surgery in August 2019 at [facility]; She has had home health PT; Patient is now being referred to outpatient PT for weakness; She continues to have numbness in BLE to feet; She is ambulating with RW all the time; Patient reports having impaired proprioception and impaired gait mechanics; She reports recent falls due to LLE buckling  (last fall was end of Oct 2019); She reports that now she is having better quad control with less buckling;     How long can you sit comfortably?  30 min;     How long can you stand comfortably?  15-20 min;     How long can you walk comfortably?  40 min;     Diagnostic tests  X-rays shows large disc bulge at L4-L5    Patient Stated Goals  "I want to be able to walk better and get stronger" reduce fear of falling;     Currently in Pain?  No/denies         TREATMENT  Gait Training Gait training performed in hallway with Lite Gait without assistive device x 300'. Pt provided cues for posture, step length, and foot placement. Pt is very deliberate with her stepping and watches her feet for most the distance to assist with placement. Mild L knee hyperextension. No buckling noted on either side. Pt reports that walking in the Lite Gait without a walker is very emotionally encouraging for her.  Ther-ex  Crosstrainer HIIT L6-7 x 30s, L3 x 60s for 5 minutes total during history while monitoring fatigue and adjusting resistance as well as obtaining history;   Leg press: RLE 90# x 20, 105# x 20, LLE 45# x 20  with cues to slow down LE movement and improve knee position for better quad control; LLE only 45# x 20, 50# x 11 (fatigue at rep 11) with cues for positioning to improve LLE control;    Neuromuscular Re-education  Toe taps to stepping stone without UE support alternating LE x 10 each; Toe taps to 6" cone without UE support alternating LE x 10 each;  Eyes closed balance NBOS 30s x 2 with intermittent touching bars;   Pt educated throughout session about proper posture and technique with exercises. Improved exercise technique, movement at target joints, use of target muscles after min to mod verbal, visual, tactile cues.    Gait training performed in hallway with patient using Lite Gait without assistive device today. Pt is very deliberate with her stepping and watches her feet for most  the distance to assist with placement. Mild L knee hyperextension. No buckling noted on either side. Pt reports that walking in the Lite Gait without a walker is very emotionally encouraging for her. She demonstrates considerable weakness in LLE compared to RLE during leg press but is able to increase her resistance and repetitions today. She reports mild fatigue at end of session but denies any increase in pain. She will benefit from additional skilled PT intervention to improve strength, balance and gait safety.                   PT Short Term Goals - 04/08/18 1454      PT SHORT TERM GOAL #1   Title  Patient will be adherent to HEP at least 3x a week to improve functional strength and balance for better safety at home.    Time  2    Period  Weeks    Status  New    Target Date  04/22/18      PT SHORT TERM GOAL #2   Title  Patient will improve lumbar AROM: flexion >30 degrees, lateral flexion >15 degrees bilaterally to improve functional ROM when picking up items from floor;     Time  2    Period  Weeks    Status  New    Target Date  04/22/18        PT Long Term Goals - 04/08/18 1455      PT LONG TERM GOAL #1   Title  Patient (> 65 years old) will complete five times sit to stand test in < 15 seconds indicating an increased LE strength and improved balance.    Time  6    Period  Weeks    Status  New    Target Date  05/20/18      PT LONG TERM GOAL #2   Title  Patient will increase Berg Balance score by > 6 points to demonstrate decreased fall risk during functional activities.    Time  6    Period  Weeks    Status  New    Target Date  05/20/18      PT LONG TERM GOAL #3   Title  Patient will increase six minute walk test distance to >1000 for progression to community ambulator and improve gait ability    Time  6    Period  Weeks    Status  New    Target Date  05/20/18      PT LONG TERM GOAL #4   Title  Patient will increase BLE gross strength to 4+/5 as to  improve functional strength for independent gait, increased  standing tolerance and increased ADL ability.    Time  6    Period  Weeks    Status  New    Target Date  05/20/18            Plan - 04/17/18 0856    Clinical Impression Statement  Gait training performed in hallway with patient using Lite Gait without assistive device today. Pt is very deliberate with her stepping and watches her feet for most the distance to assist with placement. Mild L knee hyperextension. No buckling noted on either side. Pt reports that walking in the Lite Gait without a walker is very emotionally encouraging for her. She demonstrates considerable weakness in LLE compared to RLE during leg press but is able to increase her resistance and repetitions today. She reports mild fatigue at end of session but denies any increase in pain. She will benefit from additional skilled PT intervention to improve strength, balance and gait safety.     Rehab Potential  Good    Clinical Impairments Affecting Rehab Potential  patient highly motivated and committed to physical fitness, already attending silver sneakers class    PT Frequency  1x / week    PT Duration  6 weeks    PT Treatment/Interventions  Cryotherapy;Electrical Stimulation;Moist Heat;Gait training;DME Instruction;Stair training;Functional mobility training;Therapeutic activities;Therapeutic exercise;Balance training;Neuromuscular re-education;Patient/family education;Manual techniques;Energy conservation;Dry needling    PT Next Visit Plan  Continue with strength, balance, and gait training    PT Home Exercise Plan  will address next visit    Consulted and Agree with Plan of Care  Patient       Patient will benefit from skilled therapeutic intervention in order to improve the following deficits and impairments:  Abnormal gait, Decreased endurance, Hypomobility, Decreased activity tolerance, Decreased strength, Pain, Difficulty walking, Decreased mobility,  Decreased balance, Decreased range of motion, Improper body mechanics, Decreased safety awareness  Visit Diagnosis: Muscle weakness (generalized)  Difficulty in walking, not elsewhere classified  Unsteadiness on feet     Problem List There are no active problems to display for this patient.  Phillips Grout PT, DPT, GCS  , 04/17/2018, 10:27 AM  [place] MAIN [facility] SERVICES [address] [place], [place], [address] Phone: [phone]   Fax:  [phone]  Name: PATRIZIA PAULE MRN: [id number] Date of Birth: 1950/10/30

## 2018-04-18 ENCOUNTER — Ambulatory Visit: Payer: Medicare Other

## 2018-04-23 ENCOUNTER — Ambulatory Visit: Payer: Medicare Other

## 2018-04-24 ENCOUNTER — Ambulatory Visit: Payer: Medicare HMO

## 2018-04-24 VITALS — BP 138/64 | HR 71

## 2018-04-24 DIAGNOSIS — M6281 Muscle weakness (generalized): Secondary | ICD-10-CM | POA: Diagnosis not present

## 2018-04-24 DIAGNOSIS — R2681 Unsteadiness on feet: Secondary | ICD-10-CM

## 2018-04-24 DIAGNOSIS — R262 Difficulty in walking, not elsewhere classified: Secondary | ICD-10-CM

## 2018-04-24 NOTE — Therapy (Signed)
[place] MAIN [facility] SERVICES [address] [place], [place], [address] Phone: [phone]   Fax:  [phone]  Physical Therapy Treatment  Patient Details  Name: Brittany Duran MRN: [id number] Date of Birth: [birth year] Referring Provider (PT): Tawni Carnes, MD/ Elayne Guerin, MD   Encounter Date: 04/24/2018  PT End of Session - 04/24/18 0859    Visit Number  4    Number of Visits  7    Date for PT Re-Evaluation  05/20/18    Authorization Type  4/10 start of care: 04/08/18    PT Start Time  0900    PT Stop Time  0945    PT Time Calculation (min)  45 min    Equipment Utilized During Treatment  Gait belt    Activity Tolerance  Patient tolerated treatment well;No increased pain    Behavior During Therapy  WFL for tasks assessed/performed       Past Medical History:  Diagnosis Date  . Cancer ([place])   . Hypertension     Past Surgical History:  Procedure Laterality Date  . BREAST SURGERY    . MASTECTOMY      Vitals:   04/24/18 0904  BP: 138/64  Pulse: 71  SpO2: 100%    Subjective Assessment - 04/24/18 0859    Subjective  Pt reports doing well upon arrival. No falls since last visit. No changes in health. No specific questions or concerns. She saw her neurosurgeon and he suggested possibly seeing a neurologist for further input.     Pertinent History  67 yo Female reports increased weakness in BLE; Patient had lumbar discectomy surgery in July 2019; Upon waking, patient was unable to move legs and was in the hospital for a week; She was discharged to short term rehab and came down with MRSA; patient underwent a second surgery in August 2019 at [facility]; She has had home health PT; Patient is now being referred to outpatient PT for weakness; She continues to have numbness in BLE to feet; She is ambulating with RW all the time; Patient reports having impaired proprioception and impaired gait mechanics; She reports recent falls  due to LLE buckling (last fall was end of Oct 2019); She reports that now she is having better quad control with less buckling;     How long can you sit comfortably?  30 min;     How long can you stand comfortably?  15-20 min;     How long can you walk comfortably?  40 min;     Diagnostic tests  X-rays shows large disc bulge at L4-L5    Patient Stated Goals  "I want to be able to walk better and get stronger" reduce fear of falling;     Currently in Pain?  No/denies          TREATMENT   Ther-ex  Crosstrainer HIIT L7-8 x 30s, L3 x 60s for 5 minutes total during history while monitoring fatigue and adjusting resistance as well as obtaining history and educating about chronic pain.   Leg press: RLE 105# x 20, 120# x 10 assist for the second set required toward the finals reps; LLE only 50# x 10, x 6 with cues for positioning to improve LLE control, notable fatigue and assist required to initiate first repetition during both sets;   Sit to stand with RLE extended and elevated on pad x 10, assist provided by therapist to prevent weight shifting over RLE;   Neuromuscular Re-education  Toe taps to 6" cone without UE support alternating LE x 10 each;  6" Hurdle steps without UE support x 10 each; Forward/backward/side stepping in // bars without UE support x multiple lengths each; TNE education regarding chronic pain related to her back and lower extremities; Stair training with patient performing 4 steps with L unilateral rail during ascend and R rail during descend, education about how to perform with single rail. Pt also practiced 2 steps backwards with therapist supporting walker.   Pt educated throughout session about proper posture and technique with exercises. Improved exercise technique, movement at target joints, use of target muscles after min to mod verbal, visual, tactile cues.    Pt is able to perform some limited ambulation today in parallel bars without an assistive  device. She is able to increase her resistance on the leg press today however remains significantly limited in strength on the L side. Stair training performed with patient using unilateral railing today because she will be going to some homes over the holidays without bilateral railings. She reports mild fatigue at end of session but denies any increase in pain. She will benefit from additional skilled PT intervention to improve strength, balance and gait safety.                        PT Short Term Goals - 04/08/18 1454      PT SHORT TERM GOAL #1   Title  Patient will be adherent to HEP at least 3x a week to improve functional strength and balance for better safety at home.    Time  2    Period  Weeks    Status  New    Target Date  04/22/18      PT SHORT TERM GOAL #2   Title  Patient will improve lumbar AROM: flexion >30 degrees, lateral flexion >15 degrees bilaterally to improve functional ROM when picking up items from floor;     Time  2    Period  Weeks    Status  New    Target Date  04/22/18        PT Long Term Goals - 04/08/18 1455      PT LONG TERM GOAL #1   Title  Patient (> 65 years old) will complete five times sit to stand test in < 15 seconds indicating an increased LE strength and improved balance.    Time  6    Period  Weeks    Status  New    Target Date  05/20/18      PT LONG TERM GOAL #2   Title  Patient will increase Berg Balance score by > 6 points to demonstrate decreased fall risk during functional activities.    Time  6    Period  Weeks    Status  New    Target Date  05/20/18      PT LONG TERM GOAL #3   Title  Patient will increase six minute walk test distance to >1000 for progression to community ambulator and improve gait ability    Time  6    Period  Weeks    Status  New    Target Date  05/20/18      PT LONG TERM GOAL #4   Title  Patient will increase BLE gross strength to 4+/5 as to improve functional strength for  independent gait, increased standing tolerance and increased ADL ability.    Time  6  Period  Weeks    Status  New    Target Date  05/20/18            Plan - 04/24/18 0900    Clinical Impression Statement  Pt is able to perform some limited ambulation today in parallel bars without an assistive device. She is able to increase her resistance on the leg press today however remains significantly limited in strength on the L side. Stair training performed with patient using unilateral railing today because she will be going to some homes over the holidays without bilateral railings. She reports mild fatigue at end of session but denies any increase in pain. She will benefit from additional skilled PT intervention to improve strength, balance and gait safety.     Rehab Potential  Good    Clinical Impairments Affecting Rehab Potential  patient highly motivated and committed to physical fitness, already attending silver sneakers class    PT Frequency  1x / week    PT Duration  6 weeks    PT Treatment/Interventions  Cryotherapy;Electrical Stimulation;Moist Heat;Gait training;DME Instruction;Stair training;Functional mobility training;Therapeutic activities;Therapeutic exercise;Balance training;Neuromuscular re-education;Patient/family education;Manual techniques;Energy conservation;Dry needling    PT Next Visit Plan  Continue with strength, balance, and gait training    PT Home Exercise Plan  will address next visit    Consulted and Agree with Plan of Care  Patient       Patient will benefit from skilled therapeutic intervention in order to improve the following deficits and impairments:  Abnormal gait, Decreased endurance, Hypomobility, Decreased activity tolerance, Decreased strength, Pain, Difficulty walking, Decreased mobility, Decreased balance, Decreased range of motion, Improper body mechanics, Decreased safety awareness  Visit Diagnosis: Muscle weakness (generalized)  Difficulty in  walking, not elsewhere classified  Unsteadiness on feet     Problem List There are no active problems to display for this patient.  Phillips Grout PT, DPT, GCS  Huprich,Jason 04/24/2018, 2:50 PM  [place] MAIN [facility] SERVICES [address] [place], [place], [address] Phone: [phone]   Fax:  [phone]  Name: COLBIE SLIKER MRN: [id number] Date of Birth: 08/03/1950

## 2018-05-02 ENCOUNTER — Ambulatory Visit: Payer: Medicare HMO

## 2018-05-02 DIAGNOSIS — M6281 Muscle weakness (generalized): Secondary | ICD-10-CM

## 2018-05-02 DIAGNOSIS — R2681 Unsteadiness on feet: Secondary | ICD-10-CM

## 2018-05-02 DIAGNOSIS — R262 Difficulty in walking, not elsewhere classified: Secondary | ICD-10-CM

## 2018-05-02 NOTE — Therapy (Signed)
[place] MAIN [facility] SERVICES [address] [place], [place], [address] Phone: [phone]   Fax:  [phone]  Physical Therapy Treatment  Patient Details  Name: Brittany Duran MRN: [id number] Date of Birth: 03-25-1951 Referring Provider (PT): Tawni Carnes, MD/ Elayne Guerin, MD   Encounter Date: 05/02/2018  PT End of Session - 05/02/18 0921    Visit Number  5    Number of Visits  7    Date for PT Re-Evaluation  05/20/18    Authorization Type  5/10 start of care: 04/08/18    PT Start Time  0915    PT Stop Time  1000    PT Time Calculation (min)  45 min    Equipment Utilized During Treatment  Gait belt    Activity Tolerance  Patient tolerated treatment well;No increased pain    Behavior During Therapy  WFL for tasks assessed/performed       Past Medical History:  Diagnosis Date  . Cancer ([place])   . Hypertension     Past Surgical History:  Procedure Laterality Date  . BREAST SURGERY    . MASTECTOMY      There were no vitals filed for this visit.  Subjective Assessment - 05/02/18 0918    Subjective  Pt reports doing well upon arrival. No falls since last visit. No changes in health. No specific questions or concerns. Went to visit family over the holidays and did not have any trouble with the stairs but did note that she got sore from prolonged sitting.     Pertinent History  67 yo Female reports increased weakness in BLE; Patient had lumbar discectomy surgery in July 2019; Upon waking, patient was unable to move legs and was in the hospital for a week; She was discharged to short term rehab and came down with MRSA; patient underwent a second surgery in August 2019 at [facility]; She has had home health PT; Patient is now being referred to outpatient PT for weakness; She continues to have numbness in BLE to feet; She is ambulating with RW all the time; Patient reports having impaired proprioception and impaired gait mechanics;  She reports recent falls due to LLE buckling (last fall was end of Oct 2019); She reports that now she is having better quad control with less buckling;     How long can you sit comfortably?  30 min;     How long can you stand comfortably?  15-20 min;     How long can you walk comfortably?  40 min;     Diagnostic tests  X-rays shows large disc bulge at L4-L5    Patient Stated Goals  "I want to be able to walk better and get stronger" reduce fear of falling;     Currently in Pain?  No/denies         TREATMENT   Ther-ex Crosstrainer HIIT L8 x 30s, L4 x 60s for 5 minutes total during history without UE sasist, fatigue monitored. Pt can likely increase resistance at next session;  Leg press: RLE 120# x 15, 125# x 20; LLE only 50# x 15, x 20, no assist required to initiate an sets;   Sit to stand with RLE extended and elevated on pad 2 x 10, assist provided by therapist to prevent weight shifting over RLE; Standing TKE with green tband in // bars with BUE support 2 x 10 bilateral;   Gait Training Forward/backward/side stepping in // bars without UE support x  multiple lengths each, cues provided to patient to keep gaze forward and to increase step length. No overt LOB with increased step length; Loftstrand crutch training for 2 laps around PT gym totalling approximately 150.' Instruction provided for proper sequencing with crutches with multiple cues to sequence correctly during practice. Pt with intermittent buckling but no overt LOB or external assist to prevent falls.    Pt educated throughout session about proper posture and technique with exercises. Improved exercise technique, movement at target joints, use of target muscles after min to mod verbal, visual, tactile cues.   Pt is able to progress resistance and reps with leg press today demonstrating an increase in strength. She is also able to perform ambulation again in the parallel bars without UE support and is able to  increase her step length without any LOB. Practiced ambulation in gym with Lofstrand crutches but is timid and does buckle a few times.She reports mild fatigue at end of session but denies any increase in pain.She willbenefit from additional skilled PT intervention to improve strength, balance and gait safety.                        PT Short Term Goals - 04/08/18 1454      PT SHORT TERM GOAL #1   Title  Patient will be adherent to HEP at least 3x a week to improve functional strength and balance for better safety at home.    Time  2    Period  Weeks    Status  New    Target Date  04/22/18      PT SHORT TERM GOAL #2   Title  Patient will improve lumbar AROM: flexion >30 degrees, lateral flexion >15 degrees bilaterally to improve functional ROM when picking up items from floor;     Time  2    Period  Weeks    Status  New    Target Date  04/22/18        PT Long Term Goals - 04/08/18 1455      PT LONG TERM GOAL #1   Title  Patient (> 41 years old) will complete five times sit to stand test in < 15 seconds indicating an increased LE strength and improved balance.    Time  6    Period  Weeks    Status  New    Target Date  05/20/18      PT LONG TERM GOAL #2   Title  Patient will increase Berg Balance score by > 6 points to demonstrate decreased fall risk during functional activities.    Time  6    Period  Weeks    Status  New    Target Date  05/20/18      PT LONG TERM GOAL #3   Title  Patient will increase six minute walk test distance to >1000 for progression to community ambulator and improve gait ability    Time  6    Period  Weeks    Status  New    Target Date  05/20/18      PT LONG TERM GOAL #4   Title  Patient will increase BLE gross strength to 4+/5 as to improve functional strength for independent gait, increased standing tolerance and increased ADL ability.    Time  6    Period  Weeks    Status  New    Target Date  05/20/18  Plan - 05/02/18 0921    Clinical Impression Statement  Pt is able to progress resistance and reps with leg press today demonstrating an increase in strength. She is also able to perform ambulation again in the parallel bars without UE support and is able to increase her step length without any LOB. Practiced ambulation in gym with Lofstrand crutches but is timid and does buckle a few times.She reports mild fatigue at end of session but denies any increase in pain.She willbenefit from additional skilled PT intervention to improve strength, balance and gait safety.    Rehab Potential  Good    Clinical Impairments Affecting Rehab Potential  patient highly motivated and committed to physical fitness, already attending silver sneakers class    PT Frequency  1x / week    PT Duration  6 weeks    PT Treatment/Interventions  Cryotherapy;Electrical Stimulation;Moist Heat;Gait training;DME Instruction;Stair training;Functional mobility training;Therapeutic activities;Therapeutic exercise;Balance training;Neuromuscular re-education;Patient/family education;Manual techniques;Energy conservation;Dry needling    PT Next Visit Plan  Continue with strength, balance, and gait training    PT Home Exercise Plan  --    Consulted and Agree with Plan of Care  Patient       Patient will benefit from skilled therapeutic intervention in order to improve the following deficits and impairments:  Abnormal gait, Decreased endurance, Hypomobility, Decreased activity tolerance, Decreased strength, Pain, Difficulty walking, Decreased mobility, Decreased balance, Decreased range of motion, Improper body mechanics, Decreased safety awareness  Visit Diagnosis: Muscle weakness (generalized)  Difficulty in walking, not elsewhere classified  Unsteadiness on feet     Problem List There are no active problems to display for this patient.  Phillips Grout PT, DPT, GCS  Brittany Duran 05/02/2018, 11:19  AM  [place] MAIN [facility] SERVICES [address] [place], [place], [address] Phone: [phone]   Fax:  [phone]  Name: Brittany Duran MRN: [id number] Date of Birth: 02-01-1951

## 2018-05-06 ENCOUNTER — Ambulatory Visit: Payer: Medicare HMO

## 2018-05-06 DIAGNOSIS — M6281 Muscle weakness (generalized): Secondary | ICD-10-CM | POA: Diagnosis not present

## 2018-05-06 DIAGNOSIS — R262 Difficulty in walking, not elsewhere classified: Secondary | ICD-10-CM

## 2018-05-06 DIAGNOSIS — R2681 Unsteadiness on feet: Secondary | ICD-10-CM

## 2018-05-06 NOTE — Therapy (Signed)
[place] MAIN [facility] SERVICES [address] [place], [place], [address] Phone: [phone]   Fax:  [phone]  Physical Therapy Treatment  Patient Details  Name: Brittany Duran MRN: [id number] Date of Birth: Jan 10, 1951 Referring Provider (PT): Tawni Carnes, MD/ Elayne Guerin, MD   Encounter Date: 05/06/2018  PT End of Session - 05/06/18 0902    Visit Number  6    Number of Visits  7    Date for PT Re-Evaluation  05/20/18    Authorization Type  6/10 start of care: 04/08/18    PT Start Time  0901    PT Stop Time  0947    PT Time Calculation (min)  46 min    Equipment Utilized During Treatment  Gait belt    Activity Tolerance  Patient tolerated treatment well    Behavior During Therapy  [place] for tasks assessed/performed       Past Medical History:  Diagnosis Date  . Cancer ([place])   . Hypertension     Past Surgical History:  Procedure Laterality Date  . BREAST SURGERY    . MASTECTOMY      There were no vitals filed for this visit.  Subjective Assessment - 05/06/18 0901    Subjective  Pt reports doing well upon arrival. No falls since last visit. No changes in health. No specific questions or concerns.    Pertinent History  66 yo Female reports increased weakness in BLE; Patient had lumbar discectomy surgery in July 2019; Upon waking, patient was unable to move legs and was in the hospital for a week; She was discharged to short term rehab and came down with MRSA; patient underwent a second surgery in August 2019 at [facility]; She has had home health PT; Patient is now being referred to outpatient PT for weakness; She continues to have numbness in BLE to feet; She is ambulating with RW all the time; Patient reports having impaired proprioception and impaired gait mechanics; She reports recent falls due to LLE buckling (last fall was end of Oct 2019); She reports that now she is having better quad control with less buckling;     How long can you sit comfortably?  30 min;     How long can you stand comfortably?  15-20 min;     How long can you walk comfortably?  40 min;     Diagnostic tests  X-rays shows large disc bulge at L4-L5    Patient Stated Goals  "I want to be able to walk better and get stronger" reduce fear of falling;     Currently in Pain?  No/denies            TREATMENT   Ther-ex Crosstrainer HIIT L8x 30s, L4 x 60s for 5 minutes total during history without UE sasist, fatigue monitored. Pt can likely increase resistance at next session;  Leg press: RLE 125# x 20, 135# x 17; LLE only 60# 2 x 10,assist required for last 2 reps or first set but none on the second set;   Seated LAQ with manual resistance x 10; Seated clams with manual resistance x 10; Seated adductor squeeze with manual resistance x 10; Standing heel raises with BUE support x 20; Step-ups to 4" step with single UE support alternating LE x 10; Standing TKE with green tband in // bars with BUE support 2 x 10 bilateral; Wall squat with pball behind back 10s on/10s off x 7, added dynadisc under RLE to  encourage weight shifting to LLE x 7;   Gait Training Loftstrand crutch training for approximately 375' in hallway. Minimal instruction provided for proper sequencing with crutches as pt demonstrates much better sequencing today. Performed horizontal and vertical head turns with patient intermittent as well as challenged pt with dual tasking. No buckling or overt LOB today but continued L knee hyperextension in mid stance.   Pt educated throughout session about proper posture and technique with exercises. Improved exercise technique, movement at target joints, use of target muscles after min to mod verbal, visual, tactile cues.   Pt is able to progress resistance with leg press today demonstrating an increase in strength. She is also able to increase her ambulation distance with lofstrand crutches with better sequencing  and no LOB. She demonstrates persistent LLE weakness and poor eccentric controlShe reports mild fatigue at end of session but denies any increase in pain. Will perform outcome measures at next visit and send re certification to increase frequency to 2x/wk.She willbenefit from additional skilled PT intervention to improve strength, balance and gait safety.                        PT Short Term Goals - 04/08/18 1454      PT SHORT TERM GOAL #1   Title  Patient will be adherent to HEP at least 3x a week to improve functional strength and balance for better safety at home.    Time  2    Period  Weeks    Status  New    Target Date  04/22/18      PT SHORT TERM GOAL #2   Title  Patient will improve lumbar AROM: flexion >30 degrees, lateral flexion >15 degrees bilaterally to improve functional ROM when picking up items from floor;     Time  2    Period  Weeks    Status  New    Target Date  04/22/18        PT Long Term Goals - 04/08/18 1455      PT LONG TERM GOAL #1   Title  Patient (> 62 years old) will complete five times sit to stand test in < 15 seconds indicating an increased LE strength and improved balance.    Time  6    Period  Weeks    Status  New    Target Date  05/20/18      PT LONG TERM GOAL #2   Title  Patient will increase Berg Balance score by > 6 points to demonstrate decreased fall risk during functional activities.    Time  6    Period  Weeks    Status  New    Target Date  05/20/18      PT LONG TERM GOAL #3   Title  Patient will increase six minute walk test distance to >1000 for progression to community ambulator and improve gait ability    Time  6    Period  Weeks    Status  New    Target Date  05/20/18      PT LONG TERM GOAL #4   Title  Patient will increase BLE gross strength to 4+/5 as to improve functional strength for independent gait, increased standing tolerance and increased ADL ability.    Time  6    Period  Weeks    Status   New    Target Date  05/20/18  Plan - 05/06/18 0904    Clinical Impression Statement  Pt is able to progress resistance with leg press today demonstrating an increase in strength. She is also able to increase her ambulation distance with lofstrand crutches with better sequencing and no LOB. She demonstrates persistent LLE weakness and poor eccentric controlShe reports mild fatigue at end of session but denies any increase in pain. Will perform outcome measures at next visit and send re certification to increase frequency to 2x/wk.She willbenefit from additional skilled PT intervention to improve strength, balance and gait safety.    Rehab Potential  Good    Clinical Impairments Affecting Rehab Potential  patient highly motivated and committed to physical fitness, already attending silver sneakers class    PT Frequency  1x / week    PT Duration  6 weeks    PT Treatment/Interventions  Cryotherapy;Electrical Stimulation;Moist Heat;Gait training;DME Instruction;Stair training;Functional mobility training;Therapeutic activities;Therapeutic exercise;Balance training;Neuromuscular re-education;Patient/family education;Manual techniques;Energy conservation;Dry needling    PT Next Visit Plan  Outcome measures, goals, recertification to increase frequency to 2x/wk. Continue with strength, balance, and gait training    Consulted and Agree with Plan of Care  Patient       Patient will benefit from skilled therapeutic intervention in order to improve the following deficits and impairments:  Abnormal gait, Decreased endurance, Hypomobility, Decreased activity tolerance, Decreased strength, Pain, Difficulty walking, Decreased mobility, Decreased balance, Decreased range of motion, Improper body mechanics, Decreased safety awareness  Visit Diagnosis: Muscle weakness (generalized)  Difficulty in walking, not elsewhere classified  Unsteadiness on feet     Problem List There are no  active problems to display for this patient.  Phillips Grout PT, DPT, GCS  Ananth Fiallos 05/06/2018, 9:56 AM  [place] MAIN [facility] SERVICES [address] [place], [place], [address] Phone: [phone]   Fax:  [phone]  Name: Brittany Duran MRN: [id number] Date of Birth: 01-04-1951

## 2018-05-07 ENCOUNTER — Ambulatory Visit: Payer: Medicare Other

## 2018-05-09 ENCOUNTER — Ambulatory Visit: Payer: Medicare Other | Attending: Family Medicine

## 2018-05-09 DIAGNOSIS — M6281 Muscle weakness (generalized): Secondary | ICD-10-CM | POA: Insufficient documentation

## 2018-05-09 DIAGNOSIS — R2681 Unsteadiness on feet: Secondary | ICD-10-CM

## 2018-05-09 DIAGNOSIS — R262 Difficulty in walking, not elsewhere classified: Secondary | ICD-10-CM | POA: Diagnosis present

## 2018-05-09 NOTE — Therapy (Addendum)
[place] MAIN [facility] SERVICES [address] [place], [place], [address] Phone: [phone]   Fax:  [phone]  Physical Therapy Treatment  Patient Details  Name: Brittany Duran MRN: [id number] Date of Birth: 02/07/1951 Referring Provider (PT): Tawni Carnes, MD/ Elayne Guerin, MD   Encounter Date: 05/09/2018  PT End of Session - 05/09/18 0949    Visit Number  7    Number of Visits  7    Date for PT Re-Evaluation  05/20/18    Authorization Type  7/10 start of care: 04/08/18    PT Start Time  0900    PT Stop Time  0947    PT Time Calculation (min)  47 min    Equipment Utilized During Treatment  Gait belt    Activity Tolerance  Patient tolerated treatment well    Behavior During Therapy  [facility] for tasks assessed/performed       Past Medical History:  Diagnosis Date  . Cancer ([place])   . Hypertension     Past Surgical History:  Procedure Laterality Date  . BREAST SURGERY    . MASTECTOMY      There were no vitals filed for this visit.  Subjective Assessment - 05/09/18 1058    Subjective  Pt reports doing well upon arrival. No falls since last visit. No changes in health. No specific questions or concerns. Denies pain    Pertinent History  68 yo Female reports increased weakness in BLE; Patient had lumbar discectomy surgery in July 2019; Upon waking, patient was unable to move legs and was in the hospital for a week; She was discharged to short term rehab and came down with MRSA; patient underwent a second surgery in August 2019 at [facility]; She has had home health PT; Patient is now being referred to outpatient PT for weakness; She continues to have numbness in BLE to feet; She is ambulating with RW all the time; Patient reports having impaired proprioception and impaired gait mechanics; She reports recent falls due to LLE buckling (last fall was end of Oct 2019); She reports that now she is having better quad control with less  buckling;     How long can you sit comfortably?  30 min;     How long can you stand comfortably?  15-20 min;     How long can you walk comfortably?  40 min;     Diagnostic tests  X-rays shows large disc bulge at L4-L5    Patient Stated Goals  "I want to be able to walk better and get stronger" reduce fear of falling;     Currently in Pain?  No/denies         TREATMENT   Ther-ex Crosstrainer HIIT L9x 30s, L5x 60s for 5 minutes total during history without UE assist, fatigue monitored.  Leg press: RLE 135# x 20, 150# x 20; LLE only 60# x 20, 75# x 10assist required for last 2 reps or first set but none on the second set;   Seated marches without UE support x 10 bilateral; Seated clams with manual resistance x 10; Seated adductor squeeze with manual resistance x 10; Sit to stand without UE support x 10, therapist assist to keep weight evenly distributed between right and left side;   Gait Training Loftstrand crutch training for approximately 600' in hallway. Minimal instruction provided for proper sequencing with crutches as pt demonstrates much better sequencing today. Performed horizontal and vertical head turns with patient intermittent as  well as challenged pt with dual tasking. No buckling or overt LOB today but continued L knee hyperextension in mid stance. Gait training Bodyweight support treadmill with pt unweighted at 30%. Pt provided cues to increase step length and speed continually adjusted by patient to challenge her pace. Biofeedback utilized to assist in consistent step length;   Pt educated throughout session about proper posture and technique with exercises. Improved exercise technique, movement at target joints, use of target muscles after min to mod verbal, visual, tactile cues.   Pt is able to progress resistance with leg press again today demonstrating an increase in strength. She is also able to increase her ambulation distance with lofstrand  crutches with better sequencing and no LOB. She is able to ambulate at a significantly faster speed on the bodyweight support treadmill without buckling or LOB utilizing 30% un weighting. Pt will need outcome measures at next visit and send re certification to increase frequency to 2x/wk.She willbenefit from additional skilled PT intervention to improve strength, balance and gait safety.                        PT Short Term Goals - 04/08/18 1454      PT SHORT TERM GOAL #1   Title  Patient will be adherent to HEP at least 3x a week to improve functional strength and balance for better safety at home.    Time  2    Period  Weeks    Status  New    Target Date  04/22/18      PT SHORT TERM GOAL #2   Title  Patient will improve lumbar AROM: flexion >30 degrees, lateral flexion >15 degrees bilaterally to improve functional ROM when picking up items from floor;     Time  2    Period  Weeks    Status  New    Target Date  04/22/18        PT Long Term Goals - 04/08/18 1455      PT LONG TERM GOAL #1   Title  Patient (> 62 years old) will complete five times sit to stand test in < 15 seconds indicating an increased LE strength and improved balance.    Time  6    Period  Weeks    Status  New    Target Date  05/20/18      PT LONG TERM GOAL #2   Title  Patient will increase Berg Balance score by > 6 points to demonstrate decreased fall risk during functional activities.    Time  6    Period  Weeks    Status  New    Target Date  05/20/18      PT LONG TERM GOAL #3   Title  Patient will increase six minute walk test distance to >1000 for progression to community ambulator and improve gait ability    Time  6    Period  Weeks    Status  New    Target Date  05/20/18      PT LONG TERM GOAL #4   Title  Patient will increase BLE gross strength to 4+/5 as to improve functional strength for independent gait, increased standing tolerance and increased ADL ability.    Time   6    Period  Weeks    Status  New    Target Date  05/20/18            Plan -  05/09/18 0956    Clinical Impression Statement  Pt is able to progress resistance with leg press again today demonstrating an increase in strength. She is also able to increase her ambulation distance with lofstrand crutches with better sequencing and no LOB. She is able to ambulate at a significantly faster speed on the bodyweight support treadmill without buckling or LOB utilizing 30% un weighting. Pt will need outcome measures at next visit and send re certification to increase frequency to 2x/wk.She willbenefit from additional skilled PT intervention to improve strength, balance and gait safety.    Rehab Potential  Good    Clinical Impairments Affecting Rehab Potential  patient highly motivated and committed to physical fitness, already attending silver sneakers class    PT Frequency  1x / week    PT Duration  6 weeks    PT Treatment/Interventions  Cryotherapy;Electrical Stimulation;Moist Heat;Gait training;DME Instruction;Stair training;Functional mobility training;Therapeutic activities;Therapeutic exercise;Balance training;Neuromuscular re-education;Patient/family education;Manual techniques;Energy conservation;Dry needling    PT Next Visit Plan  Outcome measures, goals, recertification to increase frequency to 2x/wk. Continue with strength, balance, and gait training    Consulted and Agree with Plan of Care  Patient       Patient will benefit from skilled therapeutic intervention in order to improve the following deficits and impairments:  Abnormal gait, Decreased endurance, Hypomobility, Decreased activity tolerance, Decreased strength, Pain, Difficulty walking, Decreased mobility, Decreased balance, Decreased range of motion, Improper body mechanics, Decreased safety awareness  Visit Diagnosis: Muscle weakness (generalized)  Difficulty in walking, not elsewhere classified  Unsteadiness on  feet     Problem List There are no active problems to display for this patient.  Phillips Grout PT, DPT, GCS  Maisley Hainsworth 05/09/2018, 11:38 AM  [place] MAIN [facility] SERVICES [address] [place], [place], [address] Phone: [phone]   Fax:  [phone]  Name: SHALAYAH BEAGLEY MRN: [id number] Date of Birth: 1951-01-22

## 2018-05-13 ENCOUNTER — Ambulatory Visit: Payer: Medicare Other

## 2018-05-13 DIAGNOSIS — R2681 Unsteadiness on feet: Secondary | ICD-10-CM

## 2018-05-13 DIAGNOSIS — M6281 Muscle weakness (generalized): Secondary | ICD-10-CM

## 2018-05-13 DIAGNOSIS — R262 Difficulty in walking, not elsewhere classified: Secondary | ICD-10-CM

## 2018-05-13 NOTE — Therapy (Addendum)
[place] MAIN [facility] SERVICES [address] [place], [place], [address] Phone: [phone]   Fax:  [phone]  Physical Therapy Treatment/Recertification  Patient Details  Name: Brittany Duran MRN: [id number] Date of Birth: Apr 13, 1951 Referring Provider (PT): Tawni Carnes, MD/ Elayne Guerin, MD   Encounter Date: 05/13/2018  PT End of Session - 05/13/18 1602    Visit Number  8    Number of Visits  23    Date for PT Re-Evaluation  07/08/18    Authorization Type  8/10, next visit is 1/10, last goals: 05/13/18, eval 04/08/18    PT Start Time  0900    PT Stop Time  0945    PT Time Calculation (min)  45 min    Equipment Utilized During Treatment  Gait belt    Activity Tolerance  Patient tolerated treatment well    Behavior During Therapy  [facility] for tasks assessed/performed       Past Medical History:  Diagnosis Date  . Cancer ([place])   . Hypertension     Past Surgical History:  Procedure Laterality Date  . BREAST SURGERY    . MASTECTOMY      There were no vitals filed for this visit.  Subjective Assessment - 05/13/18 0905    Subjective  Pt reports doing well upon arrival. No falls since last visit. No changes in health. No specific questions or concerns. Denies pain    Pertinent History  68 yo Female reports increased weakness in BLE; Patient had lumbar discectomy surgery in July 2019; Upon waking, patient was unable to move legs and was in the hospital for a week; She was discharged to short term rehab and came down with MRSA; patient underwent a second surgery in August 2019 at [facility]; She has had home health PT; Patient is now being referred to outpatient PT for weakness; She continues to have numbness in BLE to feet; She is ambulating with RW all the time; Patient reports having impaired proprioception and impaired gait mechanics; She reports recent falls due to LLE buckling (last fall was end of Oct 2019); She reports that now  she is having better quad control with less buckling;     How long can you sit comfortably?  30 min;     How long can you stand comfortably?  15-20 min;     How long can you walk comfortably?  40 min;     Diagnostic tests  X-rays shows large disc bulge at L4-L5    Patient Stated Goals  "I want to be able to walk better and get stronger" reduce fear of falling;     Currently in Pain?  No/denies         [facility] PT Assessment - 05/13/18 0927      6 Minute Walk- Baseline   6 Minute Walk- Baseline  yes    BP (mmHg)  137/58    HR (bpm)  78    02 Sat (%RA)  99 %    Modified Borg Scale for Dyspnea  0- Nothing at all    Perceived Rate of Exertion (Borg)  6-      6 Minute walk- Post Test   6 Minute Walk Post Test  yes    BP (mmHg)  145/76    HR (bpm)  103    02 Sat (%RA)  99 %    Modified Borg Scale for Dyspnea  6-    Perceived Rate of Exertion (Borg)  12-      6 minute walk test results    Aerobic Endurance Distance Walked  1100      Berg Balance Test   Sit to Stand  Able to stand without using hands and stabilize independently    Standing Unsupported  Able to stand safely 2 minutes    Sitting with Back Unsupported but Feet Supported on Floor or Stool  Able to sit safely and securely 2 minutes    Stand to Sit  Sits safely with minimal use of hands    Transfers  Able to transfer safely, definite need of hands    Standing Unsupported with Eyes Closed  Able to stand 10 seconds with supervision    Standing Ubsupported with Feet Together  Able to place feet together independently and stand 1 minute safely    From Standing, Reach Forward with Outstretched Arm  Can reach confidently >25 cm (10")    From Standing Position, Pick up Object from Floor  Able to pick up shoe, needs supervision    From Standing Position, Turn to Look Behind Over each Shoulder  Looks behind from both sides and weight shifts well    Turn 360 Degrees  Able to turn 360 degrees safely but slowly    Standing  Unsupported, Alternately Place Feet on Step/Stool  Able to complete 4 steps without aid or supervision    Standing Unsupported, One Foot in Front  Able to plae foot ahead of the other independently and hold 30 seconds    Standing on One Leg  Able to lift leg independently and hold 5-10 seconds    Total Score  47          TREATMENT   Ther-ex Performed outcome measures with patient including AROM of lumbar spine (see readings below), MMT of BLE (see readings below), 5TSTS: 12.7s, BERG: 47/56, and 6MWT: 1100'   Lumbar AROM Lumbar flexion: 25 degrees; Lateral lumbar flexion: L:20, R: 29   Strength R/L 4+/4 Hip flexion 5/5 Hip external rotation 5/4+ Hip internal rotation 4-/4 Hip extension  4/4- Hip abduction 4+/3+ Hip adduction 5/5 Knee extension 5/4+ Knee flexion 4-/4- Ankle Dorsiflexion >20 raises/15 raises Ankle Plantarflexion 5/5 Ankle Inversion 5/5 Ankle Eversion *indicates pain   Pt educated throughout session about proper technique with outcome measures. Min to mod verbal, visual, tactile cues.    Pt is making excellent progress with therapy. Her BERG improved from 32/56 at initial evaluation to 47/56 today. Her LE strength has improved since the initial evaluation. Her forward and lateral lumbar flexion has improved as well. She was unable to perform a sit to stand without UE support at initial evaluation and is able to perform 5TSTS in 12.7 seconds during session today. Her Six Minute Walk Test improved from 880' to 1100.' Pt will benefit from PT services to address deficits in strength, balance, and mobility in order to return to full function at home.                  PT Short Term Goals - 05/13/18 0905      PT SHORT TERM GOAL #1   Title  Patient will be adherent to HEP at least 3x a week to improve functional strength and balance for better safety at home.    Time  2    Period  Weeks    Status  On-going    Target Date  06/10/18      PT SHORT  TERM GOAL #2  Title  Patient will improve lumbar AROM: flexion >30 degrees, lateral flexion >15 degrees bilaterally to improve functional ROM when picking up items from floor;     Baseline  05/13/18: flexion: 25 degrees, R lateral flexion: 29, L lateral flexion: 20    Time  2    Period  Weeks    Status  Partially Met    Target Date  06/10/18        PT Long Term Goals - 05/13/18 0906      PT LONG TERM GOAL #1   Title  Patient (> 33 years old) will complete five times sit to stand test in < 15 seconds indicating an increased LE strength and improved balance.    Baseline  05/13/18: 12.7 seconds    Time  6    Period  Weeks    Status  Achieved      PT LONG TERM GOAL #2   Title  Patient will increase Berg Balance score by > 6 points to demonstrate decreased fall risk during functional activities.    Baseline  04/08/18: 32/56, 05/13/18: 47/56    Time  6    Period  Weeks    Status  Achieved      PT LONG TERM GOAL #3   Title  Patient will increase six minute walk test distance to >1000 for progression to community ambulator and improve gait ability    Baseline  05/13/18: 1100'    Time  6    Period  Weeks    Status  Achieved      PT LONG TERM GOAL #4   Title  Patient will increase BLE gross strength to 4+/5 as to improve functional strength for independent gait, increased standing tolerance and increased ADL ability.    Baseline  05/13/18: (R/L) Hip extension: 4-/4, Hip abduciton: 4/4-, Hip adduction: 4+/3+, Ankle DF: 4-/4-    Time  6    Period  Weeks    Status  Partially Met    Target Date  07/08/18      PT LONG TERM GOAL #5   Title  Pt will improve BERG by at least 3 points in order to demonstrate clinically significant improvement in balance.     Baseline  05/13/18: 47/56    Time  8    Period  Weeks    Status  New    Target Date  07/08/18      Additional Long Term Goals   Additional Long Term Goals  Yes      PT LONG TERM GOAL #6   Title  Pt will increase 6MWT by at least 7m (1677f in order to demonstrate clinically significant improvement in cardiopulmonary endurance and community ambulation     Baseline  05/13/18: 1100;    Time  8    Period  Weeks    Status  New    Target Date  07/08/18            Plan - 05/13/18 0905    Clinical Impression Statement  Pt is making excellent progress with therapy. Her BERG improved from 32/56 at initial evaluation to 47/56 today. Her LE strength has improved since the initial evaluation. Her forward and lateral lumbar flexion has improved as well. She was unable to perform a sit to stand without UE support at initial evaluation and is able to perform 5TSTS in 12.7 seconds during session today. Her Six Minute Walk Test improved from 880' to 1100.' Pt will benefit from PT  services to address deficits in strength, balance, and mobility in order to return to full function at home.     Rehab Potential  Good    Clinical Impairments Affecting Rehab Potential  patient highly motivated and committed to physical fitness, already attending silver sneakers class    PT Frequency  2x / week    PT Duration  8 weeks    PT Treatment/Interventions  Cryotherapy;Electrical Stimulation;Moist Heat;Gait training;DME Instruction;Stair training;Functional mobility training;Therapeutic activities;Therapeutic exercise;Balance training;Neuromuscular re-education;Patient/family education;Manual techniques;Energy conservation;Dry needling    PT Next Visit Plan  Continue with strength, balance, and gait training    PT Home Exercise Plan  Continue with HEP and Silver sneakers    Consulted and Agree with Plan of Care  Patient       Patient will benefit from skilled therapeutic intervention in order to improve the following deficits and impairments:  Abnormal gait, Decreased endurance, Hypomobility, Decreased activity tolerance, Decreased strength, Pain, Difficulty walking, Decreased mobility, Decreased balance, Decreased range of motion, Improper body  mechanics, Decreased safety awareness  Visit Diagnosis: Muscle weakness (generalized)  Difficulty in walking, not elsewhere classified  Unsteadiness on feet     Problem List There are no active problems to display for this patient.  Phillips Grout PT, DPT, GCS  Stony Stegmann 05/13/2018, 10:36 PM  [place] MAIN [facility] SERVICES [address] [place], [place], [address] Phone: [phone]   Fax:  [phone]  Name: Brittany Duran MRN: [id number] Date of Birth: March 02, 1951

## 2018-05-14 ENCOUNTER — Encounter: Payer: Medicare Other | Admitting: Physical Therapy

## 2018-05-15 ENCOUNTER — Ambulatory Visit: Payer: Medicare Other

## 2018-05-15 DIAGNOSIS — M6281 Muscle weakness (generalized): Secondary | ICD-10-CM | POA: Diagnosis not present

## 2018-05-15 DIAGNOSIS — R2681 Unsteadiness on feet: Secondary | ICD-10-CM

## 2018-05-15 DIAGNOSIS — R262 Difficulty in walking, not elsewhere classified: Secondary | ICD-10-CM

## 2018-05-15 NOTE — Therapy (Signed)
[place] MAIN [facility] SERVICES [address] [place], [place], [address] Phone: [phone]   Fax:  [phone]  Physical Therapy Treatment  Patient Details  Name: Brittany Duran MRN: [id number] Date of Birth: 01-08-1951 Referring Provider (PT): Tawni Carnes, MD/ Elayne Guerin, MD   Encounter Date: 05/15/2018  PT End of Session - 05/15/18 1448    Visit Number  9    Number of Visits  23    Date for PT Re-Evaluation  07/08/18    Authorization Type  1/10, last goals: 05/13/18, eval 04/08/18    PT Start Time  0900    PT Stop Time  0945    PT Time Calculation (min)  45 min    Equipment Utilized During Treatment  Gait belt    Activity Tolerance  Patient tolerated treatment well    Behavior During Therapy  [facility] for tasks assessed/performed       Past Medical History:  Diagnosis Date  . Cancer ([place])   . Hypertension     Past Surgical History:  Procedure Laterality Date  . BREAST SURGERY    . MASTECTOMY      There were no vitals filed for this visit.  Subjective Assessment - 05/15/18 1448    Subjective  Pt reports doing well upon arrival. No falls since last visit. No changes in health. No specific questions or concerns. Denies pain    Pertinent History  68 yo Female reports increased weakness in BLE; Patient had lumbar discectomy surgery in July 2019; Upon waking, patient was unable to move legs and was in the hospital for a week; She was discharged to short term rehab and came down with MRSA; patient underwent a second surgery in August 2019 at [facility]; She has had home health PT; Patient is now being referred to outpatient PT for weakness; She continues to have numbness in BLE to feet; She is ambulating with RW all the time; Patient reports having impaired proprioception and impaired gait mechanics; She reports recent falls due to LLE buckling (last fall was end of Oct 2019); She reports that now she is having better quad control with  less buckling;     How long can you sit comfortably?  30 min;     How long can you stand comfortably?  15-20 min;     How long can you walk comfortably?  40 min;     Diagnostic tests  X-rays shows large disc bulge at L4-L5    Patient Stated Goals  "I want to be able to walk better and get stronger" reduce fear of falling;     Currently in Pain?  No/denies        TREATMENT   Ther-ex Quantum leg press: RLE 150# x 20, 165# x 15; LLE 75#, 2 x 10assist required for last 2-3 reps of each set Seated clams with manual resistance x 10; Seated adductor squeeze with manual resistance x 10; Standing heel raises without UE support x 15; Forward BOSU lunges (round side up) alternating LE x 10 each; BOSU squats (round side up) x 10 with CGA/minA+1 provided by therapist;   Gait Training Gait training Bodyweight support treadmill utilizing harness for safety but not unweighting. Pt provided cues to increase step length and speed. Treadmill speed continually adjusted by patient to challenge her pace. Biofeedback utilized to assist in consistent step length; [place] crutch training x 200' followed by transition to bilateral hiking poles for an additional 400.' Education provided for  sequencing initially but she is able to improve technique quickly. Pt challenged with dual tasking conversation during ambulation. No buckling or overt LOB observed but pt does take some time to gain increased confidence with hiking poles.    Pt educated throughout session about proper posture and technique with exercises. Improved exercise technique, movement at target joints, use of target muscles after min to mod verbal, visual, tactile cues.   Pt is able to progress resistance with leg press again today demonstrating an increase in strength. She is also able toprogress to ambulation with bilateral hiking poles with no buckling or LOB. She is able to ambulate at a significantly faster speed on the  bodyweight support treadmill without buckling or LOB utilizing no unweighting today. Pt demonstrates difficulty with balance during BOSU squats and reports persistent numbness in LLE.She willbenefit from additional skilled PT intervention to improve strength, balance and gait safety.                         PT Short Term Goals - 05/13/18 0905      PT SHORT TERM GOAL #1   Title  Patient will be adherent to HEP at least 3x a week to improve functional strength and balance for better safety at home.    Time  2    Period  Weeks    Status  On-going    Target Date  06/10/18      PT SHORT TERM GOAL #2   Title  Patient will improve lumbar AROM: flexion >30 degrees, lateral flexion >15 degrees bilaterally to improve functional ROM when picking up items from floor;     Baseline  05/13/18: flexion: 25 degrees, R lateral flexion: 29, L lateral flexion: 20    Time  2    Period  Weeks    Status  Partially Met    Target Date  06/10/18        PT Long Term Goals - 05/13/18 0906      PT LONG TERM GOAL #1   Title  Patient (> 13 years old) will complete five times sit to stand test in < 15 seconds indicating an increased LE strength and improved balance.    Baseline  05/13/18: 12.7 seconds    Time  6    Period  Weeks    Status  Achieved      PT LONG TERM GOAL #2   Title  Patient will increase Berg Balance score by > 6 points to demonstrate decreased fall risk during functional activities.    Baseline  04/08/18: 32/56, 05/13/18: 47/56    Time  6    Period  Weeks    Status  Achieved      PT LONG TERM GOAL #3   Title  Patient will increase six minute walk test distance to >1000 for progression to community ambulator and improve gait ability    Baseline  05/13/18: 1100'    Time  6    Period  Weeks    Status  Achieved      PT LONG TERM GOAL #4   Title  Patient will increase BLE gross strength to 4+/5 as to improve functional strength for independent gait, increased standing  tolerance and increased ADL ability.    Baseline  05/13/18: (R/L) Hip extension: 4-/4, Hip abduciton: 4/4-, Hip adduction: 4+/3+, Ankle DF: 4-/4-    Time  6    Period  Weeks    Status  Partially Met  Target Date  07/08/18      PT LONG TERM GOAL #5   Title  Pt will improve BERG by at least 3 points in order to demonstrate clinically significant improvement in balance.     Baseline  05/13/18: 47/56    Time  8    Period  Weeks    Status  New    Target Date  07/08/18      Additional Long Term Goals   Additional Long Term Goals  Yes      PT LONG TERM GOAL #6   Title  Pt will increase 6MWT by at least 38m(1685f in order to demonstrate clinically significant improvement in cardiopulmonary endurance and community ambulation     Baseline  05/13/18: 1100;    Time  8    Period  Weeks    Status  New    Target Date  07/08/18            Plan - 05/15/18 1449    Clinical Impression Statement  Pt is able to progress resistance with leg press again today demonstrating an increase in strength. She is also able toprogress to ambulation with bilateral hiking poles with no buckling or LOB. She is able to ambulate at a significantly faster speed on the bodyweight support treadmill without buckling or LOB utilizing no unweighting today. Pt demonstrates difficulty with balance during BOSU squats and reports persistent numbness in LLE.She willbenefit from additional skilled PT intervention to improve strength, balance and gait safety.    Rehab Potential  Good    Clinical Impairments Affecting Rehab Potential  patient highly motivated and committed to physical fitness, already attending silver sneakers class    PT Frequency  2x / week    PT Duration  8 weeks    PT Treatment/Interventions  Cryotherapy;Electrical Stimulation;Moist Heat;Gait training;DME Instruction;Stair training;Functional mobility training;Therapeutic activities;Therapeutic exercise;Balance training;Neuromuscular  re-education;Patient/family education;Manual techniques;Energy conservation;Dry needling    PT Next Visit Plan  Continue with strength, balance, and gait training    PT Home Exercise Plan  Continue with HEP and Silver sneakers    Consulted and Agree with Plan of Care  Patient       Patient will benefit from skilled therapeutic intervention in order to improve the following deficits and impairments:  Abnormal gait, Decreased endurance, Hypomobility, Decreased activity tolerance, Decreased strength, Pain, Difficulty walking, Decreased mobility, Decreased balance, Decreased range of motion, Improper body mechanics, Decreased safety awareness  Visit Diagnosis: Muscle weakness (generalized)  Difficulty in walking, not elsewhere classified  Unsteadiness on feet     Problem List There are no active problems to display for this patient.  JaPhillips GroutT, DPT, GCS  Huprich,Jason 05/15/2018, 2:53 PM  [place] RE[facility]ERVICES [address]hone: [phone] Fax:  [phone]: ViWALTER MINRN: [id number] of Birth: 1008-11-1950

## 2018-05-20 ENCOUNTER — Ambulatory Visit: Payer: Medicare Other

## 2018-05-20 VITALS — BP 142/67 | HR 76

## 2018-05-20 DIAGNOSIS — R2681 Unsteadiness on feet: Secondary | ICD-10-CM

## 2018-05-20 DIAGNOSIS — M6281 Muscle weakness (generalized): Secondary | ICD-10-CM | POA: Diagnosis not present

## 2018-05-20 DIAGNOSIS — R262 Difficulty in walking, not elsewhere classified: Secondary | ICD-10-CM

## 2018-05-20 NOTE — Therapy (Signed)
[place] MAIN [facility] SERVICES [address] [place], [place], [address] Phone: [phone]   Fax:  [phone]  Physical Therapy Progress Note   Dates of reporting period  04/08/18    to   05/20/18  Patient Details  Name: Brittany Duran MRN: [id number] Date of Birth: Apr 11, 1951 Referring Provider (PT): Tawni Carnes, MD/ Elayne Guerin, MD   Encounter Date: 05/20/2018  PT End of Session - 05/20/18 0905    Visit Number  10    Number of Visits  23    Date for PT Re-Evaluation  07/08/18    Authorization Type  last goals: 05/13/18, eval 04/08/18    PT Start Time  0855    PT Stop Time  0945    PT Time Calculation (min)  50 min    Equipment Utilized During Treatment  Gait belt    Activity Tolerance  Patient tolerated treatment well    Behavior During Therapy  [facility] for tasks assessed/performed       Past Medical History:  Diagnosis Date  . Cancer ([place])   . Hypertension     Past Surgical History:  Procedure Laterality Date  . BREAST SURGERY    . MASTECTOMY      Vitals:   05/20/18 0854  BP: (!) 142/67  Pulse: 76  SpO2: 100%    Subjective Assessment - 05/20/18 0905    Subjective  Pt reports doing well upon arrival. No falls since last visit. No changes in health. No specific questions or concerns. Denies pain    Pertinent History  68 yo Female reports increased weakness in BLE; Patient had lumbar discectomy surgery in July 2019; Upon waking, patient was unable to move legs and was in the hospital for a week; She was discharged to short term rehab and came down with MRSA; patient underwent a second surgery in August 2019 at [facility]; She has had home health PT; Patient is now being referred to outpatient PT for weakness; She continues to have numbness in BLE to feet; She is ambulating with RW all the time; Patient reports having impaired proprioception and impaired gait mechanics; She reports recent falls due to LLE buckling (last  fall was end of Oct 2019); She reports that now she is having better quad control with less buckling;     How long can you sit comfortably?  30 min;     How long can you stand comfortably?  15-20 min;     How long can you walk comfortably?  40 min;     Diagnostic tests  X-rays shows large disc bulge at L4-L5    Patient Stated Goals  "I want to be able to walk better and get stronger" reduce fear of falling;     Currently in Pain?  No/denies        TREATMENT   Ther-ex Crosstrainer HIIT L9x 30s, L5x 60s for 5 minutes total during history without UE assist, fatigue monitored. Quantum leg press: RLE 165# x 6, 150# x 8, assist required intermittently, increased fatigue noted today; LLE 75# x 10, 65# x 10, considerable assist required for first set so resistance decreased for second set; Standing marches x 10 bilateral; Standing mini squats x 10 bilateral; Standing heel raises x 10 bilateral;   Neuromuscular Re-education  Ambulation with lofstrand crutches in hallway performing horizontal and vertical head turns as well as reading cards on wall 75' x 3; Balance Master Sensory Organization Test with composite score of 50.  Pt only scoring WNL during condition 1 and is below cut-off for all other conditions. She falls during all 3 trials during condition 5 (see scanned results). Reviewed results with patient;    Pt educated throughout session about proper posture and technique with exercises. Improved exercise technique, movement at target joints, use of target muscles after min to mod verbal, visual, tactile cues.   Pt is slightly weaker and more fatigued today. Her ambulation with lofstrand crutches continues to improve however she has notable instability with head turns during ambulation. Performed Education administrator Test with patient today who had a composite score of 50. Pt only scoring WNL during condition 1 and is below cut-off for all other conditions. She  falls during all 3 trials during condition 5. When her goals were last updated pt was reporting consistency with her HEP. Her forward and lateral lumbar AROM had improved notably since the initial evaluation. Her 5TSTS, BERG, 6MWT, and LE strength had all improved. She willbenefit from additional skilled PT intervention to improve strength, balance and gait safety.                         PT Short Term Goals - 05/13/18 0905      PT SHORT TERM GOAL #1   Title  Patient will be adherent to HEP at least 3x a week to improve functional strength and balance for better safety at home.    Time  2    Period  Weeks    Status  On-going    Target Date  06/10/18      PT SHORT TERM GOAL #2   Title  Patient will improve lumbar AROM: flexion >30 degrees, lateral flexion >15 degrees bilaterally to improve functional ROM when picking up items from floor;     Baseline  05/13/18: flexion: 25 degrees, R lateral flexion: 29, L lateral flexion: 20    Time  2    Period  Weeks    Status  Partially Met    Target Date  06/10/18        PT Long Term Goals - 05/13/18 0906      PT LONG TERM GOAL #1   Title  Patient (> 55 years old) will complete five times sit to stand test in < 15 seconds indicating an increased LE strength and improved balance.    Baseline  05/13/18: 12.7 seconds    Time  6    Period  Weeks    Status  Achieved      PT LONG TERM GOAL #2   Title  Patient will increase Berg Balance score by > 6 points to demonstrate decreased fall risk during functional activities.    Baseline  04/08/18: 32/56, 05/13/18: 47/56    Time  6    Period  Weeks    Status  Achieved      PT LONG TERM GOAL #3   Title  Patient will increase six minute walk test distance to >1000 for progression to community ambulator and improve gait ability    Baseline  05/13/18: 1100'    Time  6    Period  Weeks    Status  Achieved      PT LONG TERM GOAL #4   Title  Patient will increase BLE gross strength to  4+/5 as to improve functional strength for independent gait, increased standing tolerance and increased ADL ability.    Baseline  05/13/18: (R/L) Hip extension: 4-/4,  Hip abduciton: 4/4-, Hip adduction: 4+/3+, Ankle DF: 4-/4-    Time  6    Period  Weeks    Status  Partially Met    Target Date  07/08/18      PT LONG TERM GOAL #5   Title  Pt will improve BERG by at least 3 points in order to demonstrate clinically significant improvement in balance.     Baseline  05/13/18: 47/56    Time  8    Period  Weeks    Status  New    Target Date  07/08/18      Additional Long Term Goals   Additional Long Term Goals  Yes      PT LONG TERM GOAL #6   Title  Pt will increase 6MWT by at least 67m(1647f in order to demonstrate clinically significant improvement in cardiopulmonary endurance and community ambulation     Baseline  05/13/18: 1100;    Time  8    Period  Weeks    Status  New    Target Date  07/08/18            Plan - 05/20/18 1257    Clinical Impression Statement  Pt is slightly weaker and more fatigued today. Her ambulation with lofstrand crutches continues to improve however she has notable instability with head turns during ambulation. Performed BaEducation administratorest with patient today who had a composite score of 50. Pt only scoring WNL during condition 1 and is below cut-off for all other conditions. She falls during all 3 trials during condition 5. When her goals were last updated pt was reporting consistency with her HEP. Her forward and lateral lumbar AROM had improved notably since the initial evaluation. Her 5TSTS, BERG, 6MWT, and LE strength had all improved. She willbenefit from additional skilled PT intervention to improve strength, balance and gait safety.    Rehab Potential  Good    Clinical Impairments Affecting Rehab Potential  patient highly motivated and committed to physical fitness, already attending silver sneakers class    PT Frequency  2x / week     PT Duration  8 weeks    PT Treatment/Interventions  Cryotherapy;Electrical Stimulation;Moist Heat;Gait training;DME Instruction;Stair training;Functional mobility training;Therapeutic activities;Therapeutic exercise;Balance training;Neuromuscular re-education;Patient/family education;Manual techniques;Energy conservation;Dry needling    PT Next Visit Plan  Continue with strength, balance, and gait training    PT Home Exercise Plan  Continue with HEP and Silver sneakers    Consulted and Agree with Plan of Care  Patient       Patient will benefit from skilled therapeutic intervention in order to improve the following deficits and impairments:  Abnormal gait, Decreased endurance, Hypomobility, Decreased activity tolerance, Decreased strength, Pain, Difficulty walking, Decreased mobility, Decreased balance, Decreased range of motion, Improper body mechanics, Decreased safety awareness  Visit Diagnosis: Muscle weakness (generalized)  Difficulty in walking, not elsewhere classified  Unsteadiness on feet     Problem List There are no active problems to display for this patient.  JaLyndel Safeuprich PT, DPT, GCS  Kinga Cassar 05/20/2018, 1:04 PM  [place] REBardm[facility] Surgery [facility] LLCERVICES [address] [id number]: [phone] Fax:  [phone]: Brittany PEARCERN: [id number] of Birth: 1001/16/1952

## 2018-05-21 ENCOUNTER — Ambulatory Visit: Payer: Medicare Other

## 2018-05-22 ENCOUNTER — Ambulatory Visit: Payer: Medicare Other

## 2018-05-22 VITALS — BP 137/67 | HR 79

## 2018-05-22 DIAGNOSIS — M6281 Muscle weakness (generalized): Secondary | ICD-10-CM

## 2018-05-22 DIAGNOSIS — R262 Difficulty in walking, not elsewhere classified: Secondary | ICD-10-CM

## 2018-05-22 DIAGNOSIS — R2681 Unsteadiness on feet: Secondary | ICD-10-CM

## 2018-05-22 NOTE — Therapy (Signed)
[place] MAIN [facility] SERVICES [address] [place], [place], [address] Phone: [phone]   Fax:  [phone]  Physical Therapy Treatment  Patient Details  Name: Brittany Duran MRN: [id number] Date of Birth: 1950-08-07 Referring Provider (PT): Tawni Carnes, MD/ Elayne Guerin, MD   Encounter Date: 05/22/2018  PT End of Session - 05/22/18 0949    Visit Number  11    Number of Visits  23    Date for PT Re-Evaluation  07/08/18    Authorization Type  last goals: 05/13/18, eval 04/08/18    PT Start Time  0900    PT Stop Time  0947    PT Time Calculation (min)  47 min    Equipment Utilized During Treatment  Gait belt    Activity Tolerance  Patient tolerated treatment well    Behavior During Therapy  [facility] for tasks assessed/performed       Past Medical History:  Diagnosis Date  . Cancer ([place])   . Hypertension     Past Surgical History:  Procedure Laterality Date  . BREAST SURGERY    . MASTECTOMY      Vitals:   05/22/18 0902  BP: 137/67  Pulse: 79  SpO2: 100%    Subjective Assessment - 05/22/18 0927    Subjective  Pt reports doing well upon arrival. No falls since last visit. No changes in health but is reporting some L inner thigh pain which started yesterday after a long walk in [place]. However she also reports that she did quad stretches at [employer] and "I can't really feel that leg"    Pertinent History  68 yo Female reports increased weakness in BLE; Patient had lumbar discectomy surgery in July 2019; Upon waking, patient was unable to move legs and was in the hospital for a week; She was discharged to short term rehab and came down with MRSA; patient underwent a second surgery in August 2019 at [facility]; She has had home health PT; Patient is now being referred to outpatient PT for weakness; She continues to have numbness in BLE to feet; She is ambulating with RW all the time; Patient reports having impaired proprioception  and impaired gait mechanics; She reports recent falls due to LLE buckling (last fall was end of Oct 2019); She reports that now she is having better quad control with less buckling;     How long can you sit comfortably?  30 min;     How long can you stand comfortably?  15-20 min;     How long can you walk comfortably?  40 min;     Diagnostic tests  X-rays shows large disc bulge at L4-L5    Patient Stated Goals  "I want to be able to walk better and get stronger" reduce fear of falling;     Currently in Pain?  Yes    Pain Score  8     Pain Location  Leg    Pain Orientation  Left;Medial    Pain Descriptors / Indicators  Sore    Pain Type  Acute pain    Pain Onset  Yesterday        TREATMENT   Ther-ex Eccentric forward step-down SLS LLE from 6" step with BUE support on rails x 10; Eccentric lateral step-down SLS LLE from 6" step with BUE support on rails x 10; Quantum leg press: RLE150# x 20, 165# x 10 LLE 65# x 15, 75# x 10, less assist required  today compared to last session. Sit to stand from regular height chair with RLE extended on 4" step and 6# overhead ball press upon standing x 10; Seated  marches x 10 bilateral; Standing mini squats x 10 bilateral; Standing heel raises x 10 bilateral;   Gait Training Gait training in hallway starting with bilateral hiking poles performing horizontal and vertical head turns as well as gait speed changes. Pt is able to progress to no assistive device for the last 1/2 of distance.   Education provided for sequencing initially but she is able to improve technique quickly. Practiced ambulation with single point cane as well in hallway and pt is safe without any concerns for imbalance. Total ambulation distance is approximately 1000.' Pt challenged with dual tasking conversation during ambulation. No buckling or overt LOB observed but pt does take some time to gain increased confidence with hiking poles.   Pt educated throughout session  about proper posture and technique with exercises. Improved exercise technique, movement at target joints, use of target muscles after min to mod verbal, visual, tactile cues.   Pt demonstrates improved energy today. She is able to progress her ambulation to safely perform without any assistive device. She reports improved confidence with single point cane compared to hiking poles so encouraged to experiment at home with using a cane. She is able to complete additional exercises as instructed by therapist today with intermittent rest breaks for fatigue. Poor L eccentric control during step downs. She willbenefit from additional skilled PT intervention to improve strength, balance and gait safety.                         PT Short Term Goals - 05/13/18 0905      PT SHORT TERM GOAL #1   Title  Patient will be adherent to HEP at least 3x a week to improve functional strength and balance for better safety at home.    Time  2    Period  Weeks    Status  On-going    Target Date  06/10/18      PT SHORT TERM GOAL #2   Title  Patient will improve lumbar AROM: flexion >30 degrees, lateral flexion >15 degrees bilaterally to improve functional ROM when picking up items from floor;     Baseline  05/13/18: flexion: 25 degrees, R lateral flexion: 29, L lateral flexion: 20    Time  2    Period  Weeks    Status  Partially Met    Target Date  06/10/18        PT Long Term Goals - 05/13/18 0906      PT LONG TERM GOAL #1   Title  Patient (> 42 years old) will complete five times sit to stand test in < 15 seconds indicating an increased LE strength and improved balance.    Baseline  05/13/18: 12.7 seconds    Time  6    Period  Weeks    Status  Achieved      PT LONG TERM GOAL #2   Title  Patient will increase Berg Balance score by > 6 points to demonstrate decreased fall risk during functional activities.    Baseline  04/08/18: 32/56, 05/13/18: 47/56    Time  6    Period  Weeks     Status  Achieved      PT LONG TERM GOAL #3   Title  Patient will increase six minute walk test  distance to >1000 for progression to community ambulator and improve gait ability    Baseline  05/13/18: 1100'    Time  6    Period  Weeks    Status  Achieved      PT LONG TERM GOAL #4   Title  Patient will increase BLE gross strength to 4+/5 as to improve functional strength for independent gait, increased standing tolerance and increased ADL ability.    Baseline  05/13/18: (R/L) Hip extension: 4-/4, Hip abduciton: 4/4-, Hip adduction: 4+/3+, Ankle DF: 4-/4-    Time  6    Period  Weeks    Status  Partially Met    Target Date  07/08/18      PT LONG TERM GOAL #5   Title  Pt will improve BERG by at least 3 points in order to demonstrate clinically significant improvement in balance.     Baseline  05/13/18: 47/56    Time  8    Period  Weeks    Status  New    Target Date  07/08/18      Additional Long Term Goals   Additional Long Term Goals  Yes      PT LONG TERM GOAL #6   Title  Pt will increase 6MWT by at least 56m(168f in order to demonstrate clinically significant improvement in cardiopulmonary endurance and community ambulation     Baseline  05/13/18: 1100;    Time  8    Period  Weeks    Status  New    Target Date  07/08/18            Plan - 05/22/18 1138    Clinical Impression Statement  Pt demonstrates improved energy today. She is able to progress her ambulation to safely perform without any assistive device. She reports improved confidence with single point cane compared to hiking poles so encouraged to experiment at home with using a cane. She is able to complete additional exercises as instructed by therapist today with intermittent rest breaks for fatigue. Poor L eccentric control during step downs. She willbenefit from additional skilled PT intervention to improve strength, balance and gait safety.    Rehab Potential  Good    Clinical Impairments Affecting Rehab Potential   patient highly motivated and committed to physical fitness, already attending silver sneakers class    PT Frequency  2x / week    PT Duration  8 weeks    PT Treatment/Interventions  Cryotherapy;Electrical Stimulation;Moist Heat;Gait training;DME Instruction;Stair training;Functional mobility training;Therapeutic activities;Therapeutic exercise;Balance training;Neuromuscular re-education;Patient/family education;Manual techniques;Energy conservation;Dry needling    PT Next Visit Plan  Continue with strength, balance, and gait training    PT Home Exercise Plan  Continue with HEP and Silver sneakers    Consulted and Agree with Plan of Care  Patient       Patient will benefit from skilled therapeutic intervention in order to improve the following deficits and impairments:  Abnormal gait, Decreased endurance, Hypomobility, Decreased activity tolerance, Decreased strength, Pain, Difficulty walking, Decreased mobility, Decreased balance, Decreased range of motion, Improper body mechanics, Decreased safety awareness  Visit Diagnosis: Muscle weakness (generalized)  Difficulty in walking, not elsewhere classified  Unsteadiness on feet     Problem List There are no active problems to display for this patient.  JaLyndel Safeuprich PT, DPT, GCS  Huprich,Jason 05/22/2018, 11:45 AM  [facility] REM[facility] LPER[facility]ICES [address]hone: [phone] Fax:  [phone]  Name: Brittany Duran MRN: [id number] Date of Birth: 1951/03/25

## 2018-05-27 ENCOUNTER — Ambulatory Visit: Payer: Medicare Other

## 2018-05-27 DIAGNOSIS — R2681 Unsteadiness on feet: Secondary | ICD-10-CM

## 2018-05-27 DIAGNOSIS — R262 Difficulty in walking, not elsewhere classified: Secondary | ICD-10-CM

## 2018-05-27 DIAGNOSIS — M6281 Muscle weakness (generalized): Secondary | ICD-10-CM

## 2018-05-27 NOTE — Therapy (Signed)
[place] MAIN [facility] SERVICES [address] [place], [place], [address] Phone: [phone]   Fax:  [phone]  Physical Therapy Treatment  Patient Details  Name: Brittany Duran MRN: [id number] Date of Birth: 12/27/1950 Referring Provider (PT): Tawni Carnes, MD/ Elayne Guerin, MD   Encounter Date: 05/27/2018  PT End of Session - 05/27/18 0900    Visit Number  12    Number of Visits  23    Date for PT Re-Evaluation  07/08/18    Authorization Type  4/10, last goals: 05/13/18, eval 04/08/18    PT Start Time  0900    PT Stop Time  0945    PT Time Calculation (min)  45 min    Equipment Utilized During Treatment  Gait belt    Activity Tolerance  Patient tolerated treatment well    Behavior During Therapy  [facility] for tasks assessed/performed       Past Medical History:  Diagnosis Date  . Cancer ([place])   . Hypertension     Past Surgical History:  Procedure Laterality Date  . BREAST SURGERY    . MASTECTOMY      There were no vitals filed for this visit.  Subjective Assessment - 05/27/18 0906    Subjective  Pt reports soreness in LE's following PT and Silver Sneakers class last week.  She also stated that she is much more functional in the morning and that her foot drop increases in the evening.  Pt feels that she is more steady with hiking poles today but is unsure if she if comfortable with frequent use because she is still experiencing knee buckling frequently.    Pertinent History  68 yo Female reports increased weakness in BLE; Patient had lumbar discectomy surgery in July 2019; Upon waking, patient was unable to move legs and was in the hospital for a week; She was discharged to short term rehab and came down with MRSA; patient underwent a second surgery in August 2019 at [facility]; She has had home health PT; Patient is now being referred to outpatient PT for weakness; She continues to have numbness in BLE to feet; She is ambulating  with RW all the time; Patient reports having impaired proprioception and impaired gait mechanics; She reports recent falls due to LLE buckling (last fall was end of Oct 2019); She reports that now she is having better quad control with less buckling;     How long can you sit comfortably?  30 min;     How long can you stand comfortably?  15-20 min;     How long can you walk comfortably?  40 min;     Diagnostic tests  X-rays shows large disc bulge at L4-L5    Patient Stated Goals  "I want to be able to walk better and get stronger" reduce fear of falling;     Pain Onset  Yesterday      Therapeutic Exercise: Nustep: 7 min: L9 x30 sec, L5x60sec  Neuromuscular Re-ed.: Parallel Bars: Heel raises: 2x15 Toe taps 2x10 Mini squats 2x10 with hip pain Standing hip extension x10 with UE support Seated march 2x10 with red theraband STS with R LE on blue foam 2x10 with min A to shift L  Walking with hiking poles with head turns, looking up/down, gait speed change and multitasking by conversing while walking.  x7 min  PT Education - 05/27/18 0859    Education Details  balance and strengthening    Person(s) Educated  Patient    Methods  Explanation;Demonstration    Comprehension  Verbalized understanding       PT Short Term Goals - 05/13/18 0905      PT SHORT TERM GOAL #1   Title  Patient will be adherent to HEP at least 3x a week to improve functional strength and balance for better safety at home.    Time  2    Period  Weeks    Status  On-going    Target Date  06/10/18      PT SHORT TERM GOAL #2   Title  Patient will improve lumbar AROM: flexion >30 degrees, lateral flexion >15 degrees bilaterally to improve functional ROM when picking up items from floor;     Baseline  05/13/18: flexion: 25 degrees, R lateral flexion: 29, L lateral flexion: 20    Time  2    Period  Weeks    Status  Partially Met    Target Date  06/10/18        PT Long  Term Goals - 05/13/18 0906      PT LONG TERM GOAL #1   Title  Patient (> 87 years old) will complete five times sit to stand test in < 15 seconds indicating an increased LE strength and improved balance.    Baseline  05/13/18: 12.7 seconds    Time  6    Period  Weeks    Status  Achieved      PT LONG TERM GOAL #2   Title  Patient will increase Berg Balance score by > 6 points to demonstrate decreased fall risk during functional activities.    Baseline  04/08/18: 32/56, 05/13/18: 47/56    Time  6    Period  Weeks    Status  Achieved      PT LONG TERM GOAL #3   Title  Patient will increase six minute walk test distance to >1000 for progression to community ambulator and improve gait ability    Baseline  05/13/18: 1100'    Time  6    Period  Weeks    Status  Achieved      PT LONG TERM GOAL #4   Title  Patient will increase BLE gross strength to 4+/5 as to improve functional strength for independent gait, increased standing tolerance and increased ADL ability.    Baseline  05/13/18: (R/L) Hip extension: 4-/4, Hip abduciton: 4/4-, Hip adduction: 4+/3+, Ankle DF: 4-/4-    Time  6    Period  Weeks    Status  Partially Met    Target Date  07/08/18      PT LONG TERM GOAL #5   Title  Pt will improve BERG by at least 3 points in order to demonstrate clinically significant improvement in balance.     Baseline  05/13/18: 47/56    Time  8    Period  Weeks    Status  New    Target Date  07/08/18      Additional Long Term Goals   Additional Long Term Goals  Yes      PT LONG TERM GOAL #6   Title  Pt will increase 6MWT by at least 32m(1664f in order to demonstrate clinically significant improvement in cardiopulmonary endurance and community ambulation     Baseline  05/13/18: 1100;    Time  8  Period  Weeks    Status  New    Target Date  07/08/18            Plan - 05/27/18 1226    Clinical Impression Statement  Pt did very well the therapy today, reporting no pain and eager to progress  there ex.  She was able to complete all exercises with VC's and with min UE assistance for most.  She was more confident with hiking poles today with good coordination when using.  Pt did present with increased gait deviations as she fatigued.  Pt reported soreness in LE's and in posterior low back on L side following ambulation which quickly resolves with rest.  Pt required 3 rest breaks during treatment.  She will continue to benefit from skilled PT with focus on static and dynamic balance.    Rehab Potential  Good    Clinical Impairments Affecting Rehab Potential  patient highly motivated and committed to physical fitness, already attending silver sneakers class    PT Frequency  2x / week    PT Duration  8 weeks    PT Treatment/Interventions  Cryotherapy;Electrical Stimulation;Moist Heat;Gait training;DME Instruction;Stair training;Functional mobility training;Therapeutic activities;Therapeutic exercise;Balance training;Neuromuscular re-education;Patient/family education;Manual techniques;Energy conservation;Dry needling    PT Next Visit Plan  Continue with strength, balance, and gait training    PT Home Exercise Plan  Continue with HEP and Silver sneakers    Consulted and Agree with Plan of Care  Patient       Patient will benefit from skilled therapeutic intervention in order to improve the following deficits and impairments:  Abnormal gait, Decreased endurance, Hypomobility, Decreased activity tolerance, Decreased strength, Pain, Difficulty walking, Decreased mobility, Decreased balance, Decreased range of motion, Improper body mechanics, Decreased safety awareness  Visit Diagnosis: Muscle weakness (generalized)  Difficulty in walking, not elsewhere classified  Unsteadiness on feet     Problem List There are no active problems to display for this patient.   Roxanne Gates 05/27/2018, 12:50 PM  [place] MAIN [facility] SERVICES [address]  [place], [place], [address] Phone: [phone]   Fax:  [phone]  Name: Brittany Duran MRN: [id number] Date of Birth: 03/12/1951

## 2018-05-28 ENCOUNTER — Ambulatory Visit: Payer: Medicare Other

## 2018-05-29 ENCOUNTER — Ambulatory Visit: Payer: Medicare Other

## 2018-05-29 DIAGNOSIS — M6281 Muscle weakness (generalized): Secondary | ICD-10-CM

## 2018-05-29 DIAGNOSIS — R262 Difficulty in walking, not elsewhere classified: Secondary | ICD-10-CM

## 2018-05-29 DIAGNOSIS — R2681 Unsteadiness on feet: Secondary | ICD-10-CM

## 2018-05-29 NOTE — Therapy (Signed)
[place] MAIN [facility] SERVICES [address] [place], [place], [address] Phone: [phone]   Fax:  [phone]  Physical Therapy Treatment  Patient Details  Name: Brittany Duran MRN: [id number] Date of Birth: 1951/04/15 Referring Provider (PT): Tawni Carnes, MD/ Elayne Guerin, MD   Encounter Date: 05/29/2018  PT End of Session - 05/29/18 1204    Visit Number  13    Number of Visits  23    Date for PT Re-Evaluation  07/08/18    Authorization Type  5/10, last goals: 05/13/18, eval 04/08/18    PT Start Time  0845    PT Stop Time  0928    PT Time Calculation (min)  43 min    Equipment Utilized During Treatment  Gait belt    Activity Tolerance  Patient tolerated treatment well    Behavior During Therapy  [facility] for tasks assessed/performed       Past Medical History:  Diagnosis Date  . Cancer ([place])   . Hypertension     Past Surgical History:  Procedure Laterality Date  . BREAST SURGERY    . MASTECTOMY      There were no vitals filed for this visit.  Subjective Assessment - 05/29/18 0848    Subjective  Patient reported some tenderness in her low back. Reported on heavy arm days during gym class she feels some back pain the next day.     Pertinent History  68 yo Female reports increased weakness in BLE; Patient had lumbar discectomy surgery in July 2019; Upon waking, patient was unable to move legs and was in the hospital for a week; She was discharged to short term rehab and came down with MRSA; patient underwent a second surgery in August 2019 at [facility]; She has had home health PT; Patient is now being referred to outpatient PT for weakness; She continues to have numbness in BLE to feet; She is ambulating with RW all the time; Patient reports having impaired proprioception and impaired gait mechanics; She reports recent falls due to LLE buckling (last fall was end of Oct 2019); She reports that now she is having better quad control  with less buckling;     How long can you sit comfortably?  30 min;     How long can you stand comfortably?  15-20 min;     How long can you walk comfortably?  40 min;     Diagnostic tests  X-rays shows large disc bulge at L4-L5    Patient Stated Goals  "I want to be able to walk better and get stronger" reduce fear of falling;     Currently in Pain?  Yes    Pain Score  3     Pain Location  Back    Pain Orientation  Lower    Pain Descriptors / Indicators  Sore      TREATMENT:  Therapeutic Exercise: Nustep: 6 min: L9 x30 sec, L7x60sec, PT changing intervals of resistance to challenge patient throughout Ambulating with bilateral loftstrand crutches with head turns looking up/down, gait speed changes and multitasking by conversing while walking. Ambulating without AD for several lengths. CGA, cues for gait velocity, occasional heel strike bilaterally   Neuromuscular Re-ed.: Parallel Bars: Mini squats 2x10  With overpressure from PT on L side to encourage weight shift Seated march 2x10 with red theraband Standing hip abduction with RTB 2x10 Mini squats with table behind hips for tactile cues for proper technique/form of squats x15  Standing FT on foam 3x20" CGA Standing tandem stance on foam 2x20" bilaterally CGA    PT Education - 05/29/18 0850    Education Details  exercise technique/strengthening    Person(s) Educated  Patient    Methods  Explanation;Demonstration    Comprehension  Verbalized understanding       PT Short Term Goals - 05/13/18 0905      PT SHORT TERM GOAL #1   Title  Patient will be adherent to HEP at least 3x a week to improve functional strength and balance for better safety at home.    Time  2    Period  Weeks    Status  On-going    Target Date  06/10/18      PT SHORT TERM GOAL #2   Title  Patient will improve lumbar AROM: flexion >30 degrees, lateral flexion >15 degrees bilaterally to improve functional ROM when picking up items from floor;      Baseline  05/13/18: flexion: 25 degrees, R lateral flexion: 29, L lateral flexion: 20    Time  2    Period  Weeks    Status  Partially Met    Target Date  06/10/18        PT Long Term Goals - 05/13/18 0906      PT LONG TERM GOAL #1   Title  Patient (> 27 years old) will complete five times sit to stand test in < 15 seconds indicating an increased LE strength and improved balance.    Baseline  05/13/18: 12.7 seconds    Time  6    Period  Weeks    Status  Achieved      PT LONG TERM GOAL #2   Title  Patient will increase Berg Balance score by > 6 points to demonstrate decreased fall risk during functional activities.    Baseline  04/08/18: 32/56, 05/13/18: 47/56    Time  6    Period  Weeks    Status  Achieved      PT LONG TERM GOAL #3   Title  Patient will increase six minute walk test distance to >1000 for progression to community ambulator and improve gait ability    Baseline  05/13/18: 1100'    Time  6    Period  Weeks    Status  Achieved      PT LONG TERM GOAL #4   Title  Patient will increase BLE gross strength to 4+/5 as to improve functional strength for independent gait, increased standing tolerance and increased ADL ability.    Baseline  05/13/18: (R/L) Hip extension: 4-/4, Hip abduciton: 4/4-, Hip adduction: 4+/3+, Ankle DF: 4-/4-    Time  6    Period  Weeks    Status  Partially Met    Target Date  07/08/18      PT LONG TERM GOAL #5   Title  Pt will improve BERG by at least 3 points in order to demonstrate clinically significant improvement in balance.     Baseline  05/13/18: 47/56    Time  8    Period  Weeks    Status  New    Target Date  07/08/18      Additional Long Term Goals   Additional Long Term Goals  Yes      PT LONG TERM GOAL #6   Title  Pt will increase 6MWT by at least 57m(1626f in order to demonstrate clinically significant improvement in cardiopulmonary endurance and  community ambulation     Baseline  05/13/18: 1100;    Time  8    Period  Weeks     Status  New    Target Date  07/08/18            Plan - 05/29/18 1200    Clinical Impression Statement  Patient demonstrated improved confidence with ambulation with CGA, exhibited some nervousness/anxiety if asked to ambulate without AD or physical touch. Demonstrated stable ambulation with bilateral loftstrand cructches, minimal verbal cues needed.  Pt most challenged by hip strengthening exercises and proper form of a squat. Overall pt fatigued at end of session but performed well. The patient would benefit from further skilled PT to continue to progress towards goals.     Rehab Potential  Good    Clinical Impairments Affecting Rehab Potential  patient highly motivated and committed to physical fitness, already attending silver sneakers class    PT Frequency  2x / week    PT Duration  8 weeks    PT Treatment/Interventions  Cryotherapy;Electrical Stimulation;Moist Heat;Gait training;DME Instruction;Stair training;Functional mobility training;Therapeutic activities;Therapeutic exercise;Balance training;Neuromuscular re-education;Patient/family education;Manual techniques;Energy conservation;Dry needling    PT Next Visit Plan  Continue with strength, balance, and gait training    PT Home Exercise Plan  Continue with HEP and Silver sneakers    Consulted and Agree with Plan of Care  Patient       Patient will benefit from skilled therapeutic intervention in order to improve the following deficits and impairments:  Abnormal gait, Decreased endurance, Hypomobility, Decreased activity tolerance, Decreased strength, Pain, Difficulty walking, Decreased mobility, Decreased balance, Decreased range of motion, Improper body mechanics, Decreased safety awareness  Visit Diagnosis: Muscle weakness (generalized)  Difficulty in walking, not elsewhere classified  Unsteadiness on feet     Problem List There are no active problems to display for this patient.   Lieutenant Diego PT, DPT [phone]  PM,05/29/18 [place] MAIN [facility] SERVICES [address] [place], [place], [address] Phone: [phone]   Fax:  [phone]  Name: Brittany Duran MRN: [id number] Date of Birth: [birth year]

## 2018-05-30 NOTE — Addendum Note (Signed)
Addended by: Roxana Hires D on: 05/30/2018 08:47 AM   Modules accepted: Orders

## 2018-06-03 ENCOUNTER — Ambulatory Visit: Payer: Medicare Other

## 2018-06-03 VITALS — BP 141/72 | HR 71

## 2018-06-03 DIAGNOSIS — M6281 Muscle weakness (generalized): Secondary | ICD-10-CM | POA: Diagnosis not present

## 2018-06-03 DIAGNOSIS — R2681 Unsteadiness on feet: Secondary | ICD-10-CM

## 2018-06-03 DIAGNOSIS — R262 Difficulty in walking, not elsewhere classified: Secondary | ICD-10-CM

## 2018-06-03 NOTE — Therapy (Signed)
[place] MAIN [facility] SERVICES [address] [place], [place], [address] Phone: [phone]   Fax:  [phone]  Physical Therapy Treatment  Patient Details  Name: Brittany Duran MRN: [id number] Date of Birth: 12-26-1950 Referring Provider (PT): Tawni Carnes, MD/ Elayne Guerin, MD   Encounter Date: 06/03/2018  PT End of Session - 06/03/18 0900    Visit Number  14    Number of Visits  23    Date for PT Re-Evaluation  07/08/18    Authorization Type  last goals: 05/13/18, eval 04/08/18    PT Start Time  0907    PT Stop Time  0955    PT Time Calculation (min)  48 min    Equipment Utilized During Treatment  Gait belt    Activity Tolerance  Patient tolerated treatment well    Behavior During Therapy  [facility] for tasks assessed/performed       Past Medical History:  Diagnosis Date  . Cancer ([place])   . Hypertension     Past Surgical History:  Procedure Laterality Date  . BREAST SURGERY    . MASTECTOMY      Vitals:   06/03/18 0911  BP: (!) 141/72  Pulse: 71  SpO2: 100%    Subjective Assessment - 06/03/18 0900    Subjective  Patient reported some soreness in her L low back from extended standing this weekened. Otherwise she is doing well and her HEP is going well. No specific questions at this time.     Pertinent History  68 yo Female reports increased weakness in BLE; Patient had lumbar discectomy surgery in July 2019; Upon waking, patient was unable to move legs and was in the hospital for a week; She was discharged to short term rehab and came down with MRSA; patient underwent a second surgery in August 2019 at [facility]; She has had home health PT; Patient is now being referred to outpatient PT for weakness; She continues to have numbness in BLE to feet; She is ambulating with RW all the time; Patient reports having impaired proprioception and impaired gait mechanics; She reports recent falls due to LLE buckling (last fall was end of  Oct 2019); She reports that now she is having better quad control with less buckling;     How long can you sit comfortably?  30 min;     How long can you stand comfortably?  15-20 min;     How long can you walk comfortably?  40 min;     Diagnostic tests  X-rays shows large disc bulge at L4-L5    Patient Stated Goals  "I want to be able to walk better and get stronger" reduce fear of falling;     Currently in Pain?  Yes    Pain Score  4     Pain Location  Back    Pain Orientation  Left;Lower    Pain Descriptors / Indicators  Sore    Pain Type  Acute pain    Pain Onset  In the past 7 days            TREATMENT   Ther-ex Quantum leg press: RLE 165# x 10, 170# x 10; LLE 65# x 10, 75# x 15, continues to require assistance intermittently during leg press, slightly more assist required today; Sidelying hip abduction x 10 bilateral, notably more weakness in LLE; Sidelying clams x 10 bilateral, notably more weakness in LLE; Hooklying L single leg bridges x 10 with  cues to increase hip raise, notable weakness; Extensive education regarding hip pain and weakness as well as roll of glut med/glut max/TFL in hip abduction in both open and closed chain activities; Tested LLE proprioception at great toe which is severely impaired; Used roller to perform gentle STM to L posterior/lateral hip and thigh;   Gait Training Gait training Bodyweight support treadmill utilizing harness for safety band 30% body unweighting. Pt provided cues to increase step length and speed. Treadmill speed continually adjusted by patient to challenge her pace. Biofeedback utilized to assist in consistent step length. Performed horizontal and vertical head turns with patient to challenge balance. No UE support allowed during ambulation; Gait training with patient utilizing bilateral hiking poles x 1000'. For at least 60% of the distance pt is able to perform without any assistive device but holding onto hiking poles  for safety in case of buckling. Performed gait speed changes on cue during ambulation. Pt challenged with dual tasking conversation during ambulation. No buckling or overt LOB observed but pt does take some time to gain increased confidence with an assistive device and does have very mild problem with L foot placement. Increased frequency of L knee hyperextension with increased distance and without assistive device;   Pt educated throughout session about proper posture and technique with exercises. Improved exercise technique, movement at target joints, use of target muscles after min to mod verbal, visual, tactile cues.   Pt demonstrates some increased challenge today with leg press. She is able to progress her ambulation to safely perform without any assistive device. She is able to complete additional exercises as instructed by therapist today with intermittent rest breaks for fatigue. Notable weakness in L hip abduction and external rotation during sidelying exercises. She is making good progress however LLE remains very limited with respect to balance, proprioception, and strength.She willbenefit from additional skilled PT intervention to improve strength, balance and gait safety.                    PT Short Term Goals - 05/13/18 0905      PT SHORT TERM GOAL #1   Title  Patient will be adherent to HEP at least 3x a week to improve functional strength and balance for better safety at home.    Time  2    Period  Weeks    Status  On-going    Target Date  06/10/18      PT SHORT TERM GOAL #2   Title  Patient will improve lumbar AROM: flexion >30 degrees, lateral flexion >15 degrees bilaterally to improve functional ROM when picking up items from floor;     Baseline  05/13/18: flexion: 25 degrees, R lateral flexion: 29, L lateral flexion: 20    Time  2    Period  Weeks    Status  Partially Met    Target Date  06/10/18        PT Long Term Goals - 05/13/18 0906       PT LONG TERM GOAL #1   Title  Patient (> 77 years old) will complete five times sit to stand test in < 15 seconds indicating an increased LE strength and improved balance.    Baseline  05/13/18: 12.7 seconds    Time  6    Period  Weeks    Status  Achieved      PT LONG TERM GOAL #2   Title  Patient will increase Berg Balance score by > 6  points to demonstrate decreased fall risk during functional activities.    Baseline  04/08/18: 32/56, 05/13/18: 47/56    Time  6    Period  Weeks    Status  Achieved      PT LONG TERM GOAL #3   Title  Patient will increase six minute walk test distance to >1000 for progression to community ambulator and improve gait ability    Baseline  05/13/18: 1100'    Time  6    Period  Weeks    Status  Achieved      PT LONG TERM GOAL #4   Title  Patient will increase BLE gross strength to 4+/5 as to improve functional strength for independent gait, increased standing tolerance and increased ADL ability.    Baseline  05/13/18: (R/L) Hip extension: 4-/4, Hip abduciton: 4/4-, Hip adduction: 4+/3+, Ankle DF: 4-/4-    Time  6    Period  Weeks    Status  Partially Met    Target Date  07/08/18      PT LONG TERM GOAL #5   Title  Pt will improve BERG by at least 3 points in order to demonstrate clinically significant improvement in balance.     Baseline  05/13/18: 47/56    Time  8    Period  Weeks    Status  New    Target Date  07/08/18      Additional Long Term Goals   Additional Long Term Goals  Yes      PT LONG TERM GOAL #6   Title  Pt will increase 6MWT by at least 93m(1612f in order to demonstrate clinically significant improvement in cardiopulmonary endurance and community ambulation     Baseline  05/13/18: 1100;    Time  8    Period  Weeks    Status  New    Target Date  07/08/18            Plan - 06/03/18 0900    Clinical Impression Statement  Pt demonstrates some increased challenge today with leg press. She is able to progress her ambulation to safely  perform without any assistive device. She is able to complete additional exercises as instructed by therapist today with intermittent rest breaks for fatigue. Notable weakness in L hip abduction and external rotation during sidelying exercises. She is making good progress however LLE remains very limited with respect to balance, proprioception, and strength.She willbenefit from additional skilled PT intervention to improve strength, balance and gait safety.    Rehab Potential  Good    Clinical Impairments Affecting Rehab Potential  patient highly motivated and committed to physical fitness, already attending silver sneakers class    PT Frequency  2x / week    PT Duration  8 weeks    PT Treatment/Interventions  Cryotherapy;Electrical Stimulation;Moist Heat;Gait training;DME Instruction;Stair training;Functional mobility training;Therapeutic activities;Therapeutic exercise;Balance training;Neuromuscular re-education;Patient/family education;Manual techniques;Energy conservation;Dry needling    PT Next Visit Plan  Continue with strength, balance, and gait training    PT Home Exercise Plan  Continue with HEP and Silver sneakers    Consulted and Agree with Plan of Care  Patient       Patient will benefit from skilled therapeutic intervention in order to improve the following deficits and impairments:  Abnormal gait, Decreased endurance, Hypomobility, Decreased activity tolerance, Decreased strength, Pain, Difficulty walking, Decreased mobility, Decreased balance, Decreased range of motion, Improper body mechanics, Decreased safety awareness  Visit Diagnosis: Muscle weakness (generalized)  Difficulty  in walking, not elsewhere classified  Unsteadiness on feet     Problem List There are no active problems to display for this patient.  Phillips Grout PT, DPT, GCS  Chon Buhl 06/03/2018, 11:18 AM  [place] MAIN [facility] SERVICES [address]  [place], [place], [address] Phone: [phone]   Fax:  [phone]  Name: ROCKELLE HEUERMAN MRN: [id number] Date of Birth: 1951-03-23

## 2018-06-04 ENCOUNTER — Ambulatory Visit: Payer: Medicare Other

## 2018-06-05 ENCOUNTER — Ambulatory Visit: Payer: Medicare Other

## 2018-06-05 DIAGNOSIS — R262 Difficulty in walking, not elsewhere classified: Secondary | ICD-10-CM

## 2018-06-05 DIAGNOSIS — M6281 Muscle weakness (generalized): Secondary | ICD-10-CM | POA: Diagnosis not present

## 2018-06-05 DIAGNOSIS — R2681 Unsteadiness on feet: Secondary | ICD-10-CM

## 2018-06-05 NOTE — Therapy (Signed)
[place] MAIN [facility] SERVICES [address] [place], [place], [address] Phone: [phone]   Fax:  [phone]  Physical Therapy Treatment  Patient Details  Name: Brittany Duran MRN: [id number] Date of Birth: 07-30-1950 Referring Provider (PT): Tawni Carnes, MD/ Elayne Guerin, MD   Encounter Date: 06/05/2018  PT End of Session - 06/05/18 0951    Visit Number  15    Number of Visits  23    Date for PT Re-Evaluation  07/08/18    Authorization Type  last goals: 05/13/18, eval 04/08/18    PT Start Time  0853    PT Stop Time  0935    PT Time Calculation (min)  42 min    Equipment Utilized During Treatment  Gait belt    Activity Tolerance  Patient tolerated treatment well    Behavior During Therapy  [facility] for tasks assessed/performed       Past Medical History:  Diagnosis Date  . Cancer ([place])   . Hypertension     Past Surgical History:  Procedure Laterality Date  . BREAST SURGERY    . MASTECTOMY      There were no vitals filed for this visit.  Subjective Assessment - 06/05/18 0907    Subjective  Patient states she is doing well today. Denies any further back or hip pain today. States that soft tissue work last session was helpful. HEP is going well. No specific questions at this time.     Pertinent History  68 yo Female reports increased weakness in BLE; Patient had lumbar discectomy surgery in July 2019; Upon waking, patient was unable to move legs and was in the hospital for a week; She was discharged to short term rehab and came down with MRSA; patient underwent a second surgery in August 2019 at [facility]; She has had home health PT; Patient is now being referred to outpatient PT for weakness; She continues to have numbness in BLE to feet; She is ambulating with RW all the time; Patient reports having impaired proprioception and impaired gait mechanics; She reports recent falls due to LLE buckling (last fall was end of Oct 2019);  She reports that now she is having better quad control with less buckling;     How long can you sit comfortably?  30 min;     How long can you stand comfortably?  15-20 min;     How long can you walk comfortably?  40 min;     Diagnostic tests  X-rays shows large disc bulge at L4-L5    Patient Stated Goals  "I want to be able to walk better and get stronger" reduce fear of falling;     Currently in Pain?  No/denies    Pain Onset  --            TREATMENT   Ther-ex Gait training on treadmillwithout bodyweight support and no UE support. Pt provided cues to increase step length and speed. Treadmill speedcontinually adjusted by patient to challenge her pace. Biofeedback utilized to assist in consistent step length. Pt has to intermittently support herself with UE support on rails; Pball wall squats 10s hold/10s relax x 5; Pball wall squats with RLE minimally extended to force WB through LLE 10s hold/10s relax x 5; Step ups to 4" step alternating LE x 10 each with faded UE support, when leading with LLE up and RLE down she requires at least single UE support; Reverse eccentric lowering from 4" step with  toe tap on floor and return without weight shift x 5 bilateral with bilateral UE support; Heel/toe raises without UE support x 10 each; Mini squats x 10 with cues to keep more weight on LLE; Bilateral knee TKE with green tband x 10;   Neuromuscular Re-education  Toe taps to 4" step without UE support alternating LE x 10 each; Airex toe taps to 4" step without UE support alternating LE x 10 each; Airex WBOS eyes closed 2 x 30s; Airex NBOS eyes open x 30s; Airex NBOS ball passes around body with therapist varying height from waist to overhead x multiple bouts each direction;   Pt educated throughout session about proper posture and technique with exercises. Improved exercise technique, movement at target joints, use of target muscles after min to mod verbal, visual, tactile  cues.   Ptdemonstrates good motivation during session. Notable weakness with LLE endurance during wall squats and poor eccentric L quad control with step-downs. No buckling noted today during ambulation without UE support. Significant challenge today with balance when performing eyes closed on uneven surfaces such as Airex pad. Pt encouraged to continue HEP. Will continue to progress balance, strength, and gait at follow-up sessions. She willbenefit from additional skilled PT intervention to improve strength, balance and gait safety.                      PT Short Term Goals - 05/13/18 0905      PT SHORT TERM GOAL #1   Title  Patient will be adherent to HEP at least 3x a week to improve functional strength and balance for better safety at home.    Time  2    Period  Weeks    Status  On-going    Target Date  06/10/18      PT SHORT TERM GOAL #2   Title  Patient will improve lumbar AROM: flexion >30 degrees, lateral flexion >15 degrees bilaterally to improve functional ROM when picking up items from floor;     Baseline  05/13/18: flexion: 25 degrees, R lateral flexion: 29, L lateral flexion: 20    Time  2    Period  Weeks    Status  Partially Met    Target Date  06/10/18        PT Long Term Goals - 05/13/18 0906      PT LONG TERM GOAL #1   Title  Patient (> 2 years old) will complete five times sit to stand test in < 15 seconds indicating an increased LE strength and improved balance.    Baseline  05/13/18: 12.7 seconds    Time  6    Period  Weeks    Status  Achieved      PT LONG TERM GOAL #2   Title  Patient will increase Berg Balance score by > 6 points to demonstrate decreased fall risk during functional activities.    Baseline  04/08/18: 32/56, 05/13/18: 47/56    Time  6    Period  Weeks    Status  Achieved      PT LONG TERM GOAL #3   Title  Patient will increase six minute walk test distance to >1000 for progression to community ambulator and improve  gait ability    Baseline  05/13/18: 1100'    Time  6    Period  Weeks    Status  Achieved      PT LONG TERM GOAL #4   Title  Patient will increase BLE gross strength to 4+/5 as to improve functional strength for independent gait, increased standing tolerance and increased ADL ability.    Baseline  05/13/18: (R/L) Hip extension: 4-/4, Hip abduciton: 4/4-, Hip adduction: 4+/3+, Ankle DF: 4-/4-    Time  6    Period  Weeks    Status  Partially Met    Target Date  07/08/18      PT LONG TERM GOAL #5   Title  Pt will improve BERG by at least 3 points in order to demonstrate clinically significant improvement in balance.     Baseline  05/13/18: 47/56    Time  8    Period  Weeks    Status  New    Target Date  07/08/18      Additional Long Term Goals   Additional Long Term Goals  Yes      PT LONG TERM GOAL #6   Title  Pt will increase 6MWT by at least 63m(1636f in order to demonstrate clinically significant improvement in cardiopulmonary endurance and community ambulation     Baseline  05/13/18: 1100;    Time  8    Period  Weeks    Status  New    Target Date  07/08/18            Plan - 06/05/18 [id number]  Clinical Impression Statement  Ptdemonstrates good motivation during session. Notable weakness with LLE endurance during wall squats and poor eccentric L quad control with step-downs. No buckling noted today during ambulation without UE support. Significant challenge today with balance when performing eyes closed on uneven surfaces such as Airex pad. Pt encouraged to continue HEP. Will continue to progress balance, strength, and gait at follow-up sessions. She willbenefit from additional skilled PT intervention to improve strength, balance and gait safety.    Rehab Potential  Good    Clinical Impairments Affecting Rehab Potential  patient highly motivated and committed to physical fitness, already attending silver sneakers class    PT Frequency  2x / week    PT Duration  8 weeks    PT  Treatment/Interventions  Cryotherapy;Electrical Stimulation;Moist Heat;Gait training;DME Instruction;Stair training;Functional mobility training;Therapeutic activities;Therapeutic exercise;Balance training;Neuromuscular re-education;Patient/family education;Manual techniques;Energy conservation;Dry needling    PT Next Visit Plan  Continue with strength, balance, and gait training    PT Home Exercise Plan  Continue with HEP and Silver sneakers    Consulted and Agree with Plan of Care  Patient       Patient will benefit from skilled therapeutic intervention in order to improve the following deficits and impairments:  Abnormal gait, Decreased endurance, Hypomobility, Decreased activity tolerance, Decreased strength, Pain, Difficulty walking, Decreased mobility, Decreased balance, Decreased range of motion, Improper body mechanics, Decreased safety awareness  Visit Diagnosis: Muscle weakness (generalized)  Difficulty in walking, not elsewhere classified  Unsteadiness on feet     Problem List There are no active problems to display for this patient.  JaPhillips GroutT, DPT, GCS  Huprich,Jason 06/05/2018, 12:49 PM  [place] [facility] [id number]: [phone] Fax:  [phone]: Brittany Duran: [id number] of Birth: 1009/10/1950

## 2018-06-11 ENCOUNTER — Encounter: Payer: Medicare HMO | Admitting: Physical Therapy

## 2018-06-11 ENCOUNTER — Ambulatory Visit: Payer: Medicare Other | Attending: Family Medicine

## 2018-06-11 VITALS — BP 129/58 | HR 90

## 2018-06-11 DIAGNOSIS — R32 Unspecified urinary incontinence: Secondary | ICD-10-CM | POA: Insufficient documentation

## 2018-06-11 DIAGNOSIS — R262 Difficulty in walking, not elsewhere classified: Secondary | ICD-10-CM | POA: Diagnosis present

## 2018-06-11 DIAGNOSIS — M6281 Muscle weakness (generalized): Secondary | ICD-10-CM | POA: Insufficient documentation

## 2018-06-11 DIAGNOSIS — R2681 Unsteadiness on feet: Secondary | ICD-10-CM | POA: Diagnosis present

## 2018-06-11 NOTE — Therapy (Signed)
[place] MAIN [facility] SERVICES [address] [place], [place], [address] Phone: [phone]   Fax:  [phone]  Physical Therapy Treatment  Patient Details  Name: Brittany Duran MRN: [id number] Date of Birth: 1951/01/18 Referring Provider (PT): Tawni Carnes, MD/ Elayne Guerin, MD   Encounter Date: 06/11/2018  PT End of Session - 06/11/18 1027    Visit Number  16    Number of Visits  23    Date for PT Re-Evaluation  07/08/18    Authorization Type  last goals: 05/13/18, eval 04/08/18    PT Start Time  1022    PT Stop Time  1100    PT Time Calculation (min)  38 min    Equipment Utilized During Treatment  Gait belt    Activity Tolerance  Patient tolerated treatment well    Behavior During Therapy  WFL for tasks assessed/performed       Past Medical History:  Diagnosis Date  . Cancer ([place])   . Hypertension     Past Surgical History:  Procedure Laterality Date  . BREAST SURGERY    . MASTECTOMY      Vitals:   06/11/18 1026  BP: (!) 129/58  Pulse: 90  SpO2: 97%    Subjective Assessment - 06/11/18 1026    Subjective  Patient states she is doing well today. No pain upon arrival. HEP is going well. No specific questions at this time.     Pertinent History  68 yo Female reports increased weakness in BLE; Patient had lumbar discectomy surgery in July 2019; Upon waking, patient was unable to move legs and was in the hospital for a week; She was discharged to short term rehab and came down with MRSA; patient underwent a second surgery in August 2019 at [facility]; She has had home health PT; Patient is now being referred to outpatient PT for weakness; She continues to have numbness in BLE to feet; She is ambulating with RW all the time; Patient reports having impaired proprioception and impaired gait mechanics; She reports recent falls due to LLE buckling (last fall was end of Oct 2019); She reports that now she is having better quad  control with less buckling;     How long can you sit comfortably?  30 min;     How long can you stand comfortably?  15-20 min;     How long can you walk comfortably?  40 min;     Diagnostic tests  X-rays shows large disc bulge at L4-L5    Patient Stated Goals  "I want to be able to walk better and get stronger" reduce fear of falling;     Currently in Pain?  No/denies          TREATMENT   Ther-ex Gait training on treadmillwithout bodyweight support and no UE support.Pt provided cues to increase step length and speed. Treadmill speedcontinually adjusted by patient to challenge her pace. Biofeedback utilized to assist in consistent step length. Horizontal and vertical head turns on cue. Added dual task challenge. Pt unable to perform serial 7 subtraction so performed alphabet naming. Pt has to intermittently support herself with UE support on rails; Sit to stand from low mat table with 1/2 foam roll under RLE 2 x 10; Squats thighs to parallel in bars x 5, cues not to lean excessively with trunk; Partial squats with lateral steps x 5 each direction, notably more difficult with in L SLS;   Neuromuscular Re-education  MOCA  performed with patient who scored 24/30; Discussed some of the cognitive issues that pt is having and possible benefit from  Tandem forward/backward walking x 4 lengths in // bars without UE support;   Pt educated throughout session about proper posture and technique with exercises. Improved exercise technique, movement at target joints, use of target muscles after min to mod verbal, visual, tactile cues.   Ptdemonstratesgood motivation during session. No buckling noted today during ambulation without UE support on treadmill. Pt struggles with serial seven subtraction during treadmill gait but also during [place] administration. Pt states that this is something that she otherwise would have been able to complete prior to her surgery. She reports worsening of  cognition since her hospital admission and states that she worked with OT on cognitive tasks while in inpatient rehab. Performed MOCA with patient who scored 24/30. Discussed referral to OT or SLP to work on cognitive tasks. Will continue to progress balance, strength, and gait at follow-up sessions. She willbenefit from additional skilled PT intervention to improve strength, balance and gait safety.                      PT Short Term Goals - 05/13/18 0905      PT SHORT TERM GOAL #1   Title  Patient will be adherent to HEP at least 3x a week to improve functional strength and balance for better safety at home.    Time  2    Period  Weeks    Status  On-going    Target Date  06/10/18      PT SHORT TERM GOAL #2   Title  Patient will improve lumbar AROM: flexion >30 degrees, lateral flexion >15 degrees bilaterally to improve functional ROM when picking up items from floor;     Baseline  05/13/18: flexion: 25 degrees, R lateral flexion: 29, L lateral flexion: 20    Time  2    Period  Weeks    Status  Partially Met    Target Date  06/10/18        PT Long Term Goals - 05/13/18 0906      PT LONG TERM GOAL #1   Title  Patient (> 97 years old) will complete five times sit to stand test in < 15 seconds indicating an increased LE strength and improved balance.    Baseline  05/13/18: 12.7 seconds    Time  6    Period  Weeks    Status  Achieved      PT LONG TERM GOAL #2   Title  Patient will increase Berg Balance score by > 6 points to demonstrate decreased fall risk during functional activities.    Baseline  04/08/18: 32/56, 05/13/18: 47/56    Time  6    Period  Weeks    Status  Achieved      PT LONG TERM GOAL #3   Title  Patient will increase six minute walk test distance to >1000 for progression to community ambulator and improve gait ability    Baseline  05/13/18: 1100'    Time  6    Period  Weeks    Status  Achieved      PT LONG TERM GOAL #4   Title  Patient will  increase BLE gross strength to 4+/5 as to improve functional strength for independent gait, increased standing tolerance and increased ADL ability.    Baseline  05/13/18: (R/L) Hip extension: 4-/4, Hip abduciton: 4/4-, Hip  adduction: 4+/3+, Ankle DF: 4-/4-    Time  6    Period  Weeks    Status  Partially Met    Target Date  07/08/18      PT LONG TERM GOAL #5   Title  Pt will improve BERG by at least 3 points in order to demonstrate clinically significant improvement in balance.     Baseline  05/13/18: 47/56    Time  8    Period  Weeks    Status  New    Target Date  07/08/18      Additional Long Term Goals   Additional Long Term Goals  Yes      PT LONG TERM GOAL #6   Title  Pt will increase 6MWT by at least 70m(1612f in order to demonstrate clinically significant improvement in cardiopulmonary endurance and community ambulation     Baseline  05/13/18: 1100;    Time  8    Period  Weeks    Status  New    Target Date  07/08/18            Plan - 06/11/18 1027    Clinical Impression Statement  Ptdemonstratesgood motivation during session. No buckling noted today during ambulation without UE support on treadmill. Pt struggles with serial seven subtraction during treadmill gait but also during MOBakersvilledministration. Pt states that this is something that she otherwise would have been able to complete prior to her surgery. She reports worsening of cognition since her hospital admission and states that she worked with OT on cognitive tasks while in inpatient rehab. Performed MOCA with patient who scored 24/30. Discussed referral to OT or SLP to work on cognitive tasks. Will continue to progress balance, strength, and gait at follow-up sessions. She willbenefit from additional skilled PT intervention to improve strength, balance and gait safety.    Rehab Potential  Good    Clinical Impairments Affecting Rehab Potential  patient highly motivated and committed to physical fitness, already  attending silver sneakers class    PT Frequency  2x / week    PT Duration  8 weeks    PT Treatment/Interventions  Cryotherapy;Electrical Stimulation;Moist Heat;Gait training;DME Instruction;Stair training;Functional mobility training;Therapeutic activities;Therapeutic exercise;Balance training;Neuromuscular re-education;Patient/family education;Manual techniques;Energy conservation;Dry needling    PT Next Visit Plan  Continue with strength, balance, and gait training    PT Home Exercise Plan  Continue with HEP and Silver sneakers    Consulted and Agree with Plan of Care  Patient       Patient will benefit from skilled therapeutic intervention in order to improve the following deficits and impairments:  Abnormal gait, Decreased endurance, Hypomobility, Decreased activity tolerance, Decreased strength, Pain, Difficulty walking, Decreased mobility, Decreased balance, Decreased range of motion, Improper body mechanics, Decreased safety awareness  Visit Diagnosis: Muscle weakness (generalized)  Difficulty in walking, not elsewhere classified  Unsteadiness on feet     Problem List There are no active problems to display for this patient.  Brittany Duran, DPT, GCS  , 06/12/2018, 1:39 PM  Co[place] REDart[facility] CenterERVICES [address]hone: [phone] Fax:  [phone]: ViMIRTA MALLYRN: [id number] of Birth: 10May 16, 1952

## 2018-06-13 ENCOUNTER — Ambulatory Visit: Payer: Medicare Other

## 2018-06-13 VITALS — BP 159/69 | HR 80

## 2018-06-13 DIAGNOSIS — R262 Difficulty in walking, not elsewhere classified: Secondary | ICD-10-CM

## 2018-06-13 DIAGNOSIS — M6281 Muscle weakness (generalized): Secondary | ICD-10-CM

## 2018-06-13 DIAGNOSIS — R2681 Unsteadiness on feet: Secondary | ICD-10-CM

## 2018-06-13 NOTE — Therapy (Signed)
[place] MAIN [facility] SERVICES [address] [place], [place], [address] Phone: [phone]   Fax:  [phone]  Physical Therapy Treatment  Patient Details  Name: Brittany Duran MRN: [id number] Date of Birth: 1950-06-24 Referring Provider (PT): Tawni Carnes, MD/ Elayne Guerin, MD   Encounter Date: 06/13/2018  PT End of Session - 06/13/18 1352    Visit Number  17    Number of Visits  23    Date for PT Re-Evaluation  07/08/18    Authorization Type  last goals: 05/13/18, eval 04/08/18    PT Start Time  1020    PT Stop Time  1115    PT Time Calculation (min)  55 min    Equipment Utilized During Treatment  Gait belt    Activity Tolerance  Patient tolerated treatment well    Behavior During Therapy  [facility] for tasks assessed/performed       Past Medical History:  Diagnosis Date  . Cancer ([place])   . Hypertension     Past Surgical History:  Procedure Laterality Date  . BREAST SURGERY    . MASTECTOMY      Vitals:   06/13/18 1021  BP: (!) 159/69  Pulse: 80  SpO2: 100%    Subjective Assessment - 06/13/18 1352    Subjective  Patient states she is doing well today. No pain upon arrival. HEP is going well. No specific questions at this time.     Pertinent History  68 yo Female reports increased weakness in BLE; Patient had lumbar discectomy surgery in July 2019; Upon waking, patient was unable to move legs and was in the hospital for a week; She was discharged to short term rehab and came down with MRSA; patient underwent a second surgery in August 2019 at [facility]; She has had home health PT; Patient is now being referred to outpatient PT for weakness; She continues to have numbness in BLE to feet; She is ambulating with RW all the time; Patient reports having impaired proprioception and impaired gait mechanics; She reports recent falls due to LLE buckling (last fall was end of Oct 2019); She reports that now she is having better quad  control with less buckling;     How long can you sit comfortably?  30 min;     How long can you stand comfortably?  15-20 min;     How long can you walk comfortably?  40 min;     Diagnostic tests  X-rays shows large disc bulge at L4-L5    Patient Stated Goals  "I want to be able to walk better and get stronger" reduce fear of falling;     Currently in Pain?  No/denies           TREATMENT   Ther-ex Octane HIIT L8 x 30s, L4 x 60s x 5 minutes for warm-up (unbilled); Single leg press 150# x 15, 165# x 10 RLE; 60# x 15, 75# x 10 on LLE; Hooklying bridges x 10; Hooklying adductor squeeze with manual resistance x 10; Hooklying clams with manual resistance x 10; Hooklying SLR x 10 bilateral; Sidelying SLR L hip abduction x 10;  Sit to stand from low mat table with 6" wooden step under RLE and foot extended x 10, therapist assist to keep bodyweight over LLE; L single leg sit to stand from significantly elevated mat table with bilateral UE support by therapist to steady x 10;   Neuromuscular Re-education Practiced gait in gym x 100'  with quad cane in RUE, education and cues for proper sequencing with quad cane; Monofilament testing to L foot due to decreased sensation. Full response to 6.65, Inconsistent response to 5.07, and no response to 4.31. Education about different assistive devices with respect to stability.    Pt educated throughout session about proper posture and technique with exercises. Improved exercise technique, movement at target joints, use of target muscles after min to mod verbal, visual, tactile cues.   Ptdemonstratesgood motivation during session. No buckling noted today during ambulation with quad cane however notable lack of confidence. Improving sidelying hip abduction strength noted today. Notable instability with L single leg sit to stand from elevated mat table. Will continue to progress balance, strength, and gait at follow-up sessions.She  willbenefit from additional skilled PT intervention to improve strength, balance and gait safety.                         PT Short Term Goals - 05/13/18 0905      PT SHORT TERM GOAL #1   Title  Patient will be adherent to HEP at least 3x a week to improve functional strength and balance for better safety at home.    Time  2    Period  Weeks    Status  On-going    Target Date  06/10/18      PT SHORT TERM GOAL #2   Title  Patient will improve lumbar AROM: flexion >30 degrees, lateral flexion >15 degrees bilaterally to improve functional ROM when picking up items from floor;     Baseline  05/13/18: flexion: 25 degrees, R lateral flexion: 29, L lateral flexion: 20    Time  2    Period  Weeks    Status  Partially Met    Target Date  06/10/18        PT Long Term Goals - 05/13/18 0906      PT LONG TERM GOAL #1   Title  Patient (> 8 years old) will complete five times sit to stand test in < 15 seconds indicating an increased LE strength and improved balance.    Baseline  05/13/18: 12.7 seconds    Time  6    Period  Weeks    Status  Achieved      PT LONG TERM GOAL #2   Title  Patient will increase Berg Balance score by > 6 points to demonstrate decreased fall risk during functional activities.    Baseline  04/08/18: 32/56, 05/13/18: 47/56    Time  6    Period  Weeks    Status  Achieved      PT LONG TERM GOAL #3   Title  Patient will increase six minute walk test distance to >1000 for progression to community ambulator and improve gait ability    Baseline  05/13/18: 1100'    Time  6    Period  Weeks    Status  Achieved      PT LONG TERM GOAL #4   Title  Patient will increase BLE gross strength to 4+/5 as to improve functional strength for independent gait, increased standing tolerance and increased ADL ability.    Baseline  05/13/18: (R/L) Hip extension: 4-/4, Hip abduciton: 4/4-, Hip adduction: 4+/3+, Ankle DF: 4-/4-    Time  6    Period  Weeks    Status   Partially Met    Target Date  07/08/18  PT LONG TERM GOAL #5   Title  Pt will improve BERG by at least 3 points in order to demonstrate clinically significant improvement in balance.     Baseline  05/13/18: 47/56    Time  8    Period  Weeks    Status  New    Target Date  07/08/18      Additional Long Term Goals   Additional Long Term Goals  Yes      PT LONG TERM GOAL #6   Title  Pt will increase 6MWT by at least 31m(1632f in order to demonstrate clinically significant improvement in cardiopulmonary endurance and community ambulation     Baseline  05/13/18: 1100;    Time  8    Period  Weeks    Status  New    Target Date  07/08/18            Plan - 06/13/18 1447    Clinical Impression Statement  Ptdemonstratesgood motivation during session. No buckling noted today during ambulation with quad cane however notable lack of confidence. Improving sidelying hip abduction strength noted today. Notable instability with L single leg sit to stand from elevated mat table. Will continue to progress balance, strength, and gait at follow-up sessions.She willbenefit from additional skilled PT intervention to improve strength, balance and gait safety.    Rehab Potential  Good    Clinical Impairments Affecting Rehab Potential  patient highly motivated and committed to physical fitness, already attending silver sneakers class    PT Frequency  2x / week    PT Duration  8 weeks    PT Treatment/Interventions  Cryotherapy;Electrical Stimulation;Moist Heat;Gait training;DME Instruction;Stair training;Functional mobility training;Therapeutic activities;Therapeutic exercise;Balance training;Neuromuscular re-education;Patient/family education;Manual techniques;Energy conservation;Dry needling    PT Next Visit Plan  Continue with strength, balance, and gait training    PT Home Exercise Plan  Continue with HEP and Silver sneakers    Consulted and Agree with Plan of Care  Patient       Patient will  benefit from skilled therapeutic intervention in order to improve the following deficits and impairments:  Abnormal gait, Decreased endurance, Hypomobility, Decreased activity tolerance, Decreased strength, Pain, Difficulty walking, Decreased mobility, Decreased balance, Decreased range of motion, Improper body mechanics, Decreased safety awareness  Visit Diagnosis: Muscle weakness (generalized)  Difficulty in walking, not elsewhere classified  Unsteadiness on feet     Problem List There are no active problems to display for this patient.  JaPhillips GroutT, DPT, GCS  Arrie Zuercher 06/13/2018, 4:05 PM  [place]AIN REThe Corp[facility] HospitalERVICES [address]dHamlin[place]hone: [phone] Fax:  [phone]: ViREMA LIEVANOSRN: [id number] of Birth: 1006-May-1952

## 2018-06-18 ENCOUNTER — Ambulatory Visit: Payer: Medicare Other

## 2018-06-18 VITALS — BP 130/70 | HR 73

## 2018-06-18 DIAGNOSIS — R2681 Unsteadiness on feet: Secondary | ICD-10-CM

## 2018-06-18 DIAGNOSIS — R262 Difficulty in walking, not elsewhere classified: Secondary | ICD-10-CM

## 2018-06-18 DIAGNOSIS — M6281 Muscle weakness (generalized): Secondary | ICD-10-CM | POA: Diagnosis not present

## 2018-06-18 NOTE — Therapy (Signed)
[place] MAIN [facility] SERVICES [address] [place], [place], [address] Phone: [phone]   Fax:  [phone]  Physical Therapy Treatment  Patient Details  Name: Brittany Duran MRN: [id number] Date of Birth: [birth year] Referring Provider (PT): Tawni Carnes, MD/ Elayne Guerin, MD   Encounter Date: 06/18/2018  PT End of Session - 06/18/18 1024    Visit Number  18    Number of Visits  23    Date for PT Re-Evaluation  07/08/18    Authorization Type  last goals: 05/13/18, eval 04/08/18    PT Start Time  0845    PT Stop Time  0930    PT Time Calculation (min)  45 min    Equipment Utilized During Treatment  Gait belt    Activity Tolerance  Patient tolerated treatment well    Behavior During Therapy  [facility] for tasks assessed/performed       Past Medical History:  Diagnosis Date  . Cancer ([place])   . Hypertension     Past Surgical History:  Procedure Laterality Date  . BREAST SURGERY    . MASTECTOMY      Vitals:   06/18/18 0850  BP: 130/70  Pulse: 73  SpO2: 100%    Subjective Assessment - 06/18/18 0851    Subjective  Patient states she is doing well today. No pain upon arrival. HEP is going well. No specific questions at this time.     Pertinent History  68 yo Female reports increased weakness in BLE; Patient had lumbar discectomy surgery in July 2019; Upon waking, patient was unable to move legs and was in the hospital for a week; She was discharged to short term rehab and came down with MRSA; patient underwent a second surgery in August 2019 at [facility]; She has had home health PT; Patient is now being referred to outpatient PT for weakness; She continues to have numbness in BLE to feet; She is ambulating with RW all the time; Patient reports having impaired proprioception and impaired gait mechanics; She reports recent falls due to LLE buckling (last fall was end of Oct 2019); She reports that now she is having better quad control  with less buckling;     How long can you sit comfortably?  30 min;     How long can you stand comfortably?  15-20 min;     How long can you walk comfortably?  40 min;     Diagnostic tests  X-rays shows large disc bulge at L4-L5    Patient Stated Goals  "I want to be able to walk better and get stronger" reduce fear of falling;     Currently in Pain?  No/denies            TREATMENT   Ther-ex Octane HIIT L8 x 30s, L4 x 60s x 5 minutes for warm-up (unbilled); Single leg press 155# x 14, 170# x 8, LLE avoided due to pt reporting for to therapist that after leg press she has been having some L tibial pain. Pt had mentioned this pain in the past but not in relation to exercise. Hooklying bridges x 10; Hooklying single leg bridges x 10 bilateral; Hooklying SLR x 10 bilateral; Sidelying SLR L hip abduction x 10;  Sit to stand from low mat table x 10; Conversation and assessment of L proximal tibial pain. Pt reports that she fell while in rehab multiple times and landed on her L knee. She had immediate imaging which  was negative for a fracture. She reports that she occasionally feels like her lower L leg bone is going to "snap." Pain is worse following L single leg press and extended standing. Negative tuning fork test. Positive fulcrum test. Swelling and pain to palpation over proximal tibia approximately 3 inches inferior to tibial tuberosity. No redness or bruising. Pt encouraged to pursue evaluation and imaging if indicated.    Pt educated throughout session about proper posture and technique with exercises. Improved exercise technique, movement at target joints, use of target muscles after min to mod verbal, visual, tactile cues.   Ptdemonstratesgood motivation during session. Avoided L single leg press due to reported pain over tibia. Conversation and assessment of L proximal tibial pain. Pt reports that she fell while in rehab multiple times and landed on her L knee. She had  immediate imaging which was negative for a fracture. She reports that she occasionally feels like her lower L leg bone is going to "snap." Pain is worse following L single leg press and extended standing. Negative tuning fork test. Positive fulcrum test. Swelling and pain to palpation over proximal tibia approximately 3 inches inferior to tibial tuberosity. No redness or bruising. Pt encouraged to pursue evaluation and imaging if indicated. Assisted pt in calling [facility] to make an appointment for today. Pt needs updated outcome measures/goals at next session. Will continue to progress balance, strength, and gait at follow-up sessions.She willbenefit from additional skilled PT intervention to improve strength, balance and gait safety.                     PT Short Term Goals - 05/13/18 0905      PT SHORT TERM GOAL #1   Title  Patient will be adherent to HEP at least 3x a week to improve functional strength and balance for better safety at home.    Time  2    Period  Weeks    Status  On-going    Target Date  06/10/18      PT SHORT TERM GOAL #2   Title  Patient will improve lumbar AROM: flexion >30 degrees, lateral flexion >15 degrees bilaterally to improve functional ROM when picking up items from floor;     Baseline  05/13/18: flexion: 25 degrees, R lateral flexion: 29, L lateral flexion: 20    Time  2    Period  Weeks    Status  Partially Met    Target Date  06/10/18        PT Long Term Goals - 05/13/18 0906      PT LONG TERM GOAL #1   Title  Patient (> 77 years old) will complete five times sit to stand test in < 15 seconds indicating an increased LE strength and improved balance.    Baseline  05/13/18: 12.7 seconds    Time  6    Period  Weeks    Status  Achieved      PT LONG TERM GOAL #2   Title  Patient will increase Berg Balance score by > 6 points to demonstrate decreased fall risk during functional activities.    Baseline  04/08/18: 32/56, 05/13/18:  47/56    Time  6    Period  Weeks    Status  Achieved      PT LONG TERM GOAL #3   Title  Patient will increase six minute walk test distance to >1000 for progression to community ambulator and improve gait ability  Baseline  05/13/18: 1100'    Time  6    Period  Weeks    Status  Achieved      PT LONG TERM GOAL #4   Title  Patient will increase BLE gross strength to 4+/5 as to improve functional strength for independent gait, increased standing tolerance and increased ADL ability.    Baseline  05/13/18: (R/L) Hip extension: 4-/4, Hip abduciton: 4/4-, Hip adduction: 4+/3+, Ankle DF: 4-/4-    Time  6    Period  Weeks    Status  Partially Met    Target Date  07/08/18      PT LONG TERM GOAL #5   Title  Pt will improve BERG by at least 3 points in order to demonstrate clinically significant improvement in balance.     Baseline  05/13/18: 47/56    Time  8    Period  Weeks    Status  New    Target Date  07/08/18      Additional Long Term Goals   Additional Long Term Goals  Yes      PT LONG TERM GOAL #6   Title  Pt will increase 6MWT by at least 50m(1634f in order to demonstrate clinically significant improvement in cardiopulmonary endurance and community ambulation     Baseline  05/13/18: 1100;    Time  8    Period  Weeks    Status  New    Target Date  07/08/18            Plan - 06/18/18 1025    Clinical Impression Statement  Ptdemonstratesgood motivation during session. Avoided L single leg press due to reported pain over tibia. Conversation and assessment of L proximal tibial pain. Pt reports that she fell while in rehab multiple times and landed on her L knee. She had immediate imaging which was negative for a fracture. She reports that she occasionally feels like her lower L leg bone is going to "snap." Pain is worse following L single leg press and extended standing. Negative tuning fork test. Positive fulcrum test. Swelling and pain to palpation over proximal tibia  approximately 3 inches inferior to tibial tuberosity. No redness or bruising. Pt encouraged to pursue evaluation and imaging if indicated. Assisted pt in calling [facility] to make an appointment for today. Pt needs updated outcome measures/goals at next session. Will continue to progress balance, strength, and gait at follow-up sessions.She willbenefit from additional skilled PT intervention to improve strength, balance and gait safety.    Rehab Potential  Good    Clinical Impairments Affecting Rehab Potential  patient highly motivated and committed to physical fitness, already attending silver sneakers class    PT Frequency  2x / week    PT Duration  8 weeks    PT Treatment/Interventions  Cryotherapy;Electrical Stimulation;Moist Heat;Gait training;DME Instruction;Stair training;Functional mobility training;Therapeutic activities;Therapeutic exercise;Balance training;Neuromuscular re-education;Patient/family education;Manual techniques;Energy conservation;Dry needling    PT Next Visit Plan  Updated outcome measures/goals, Continue with strength, balance, and gait training    PT Home Exercise Plan  Continue with HEP and Silver sneakers    Consulted and Agree with Plan of Care  Patient       Patient will benefit from skilled therapeutic intervention in order to improve the following deficits and impairments:  Abnormal gait, Decreased endurance, Hypomobility, Decreased activity tolerance, Decreased strength, Pain, Difficulty walking, Decreased mobility, Decreased balance, Decreased range of motion, Improper body mechanics, Decreased safety awareness  Visit Diagnosis: Muscle weakness (  generalized)  Difficulty in walking, not elsewhere classified  Unsteadiness on feet     Problem List There are no active problems to display for this patient.  Phillips Grout PT, DPT, GCS  Huprich,Jason 06/18/2018, 12:53 PM  [place] MAIN [facility] SERVICES [address] [place], [place], [address] Phone: [phone]   Fax:  [phone]  Name: LAQUITHA HESLIN MRN: [id number] Date of Birth: [birth year]

## 2018-06-20 ENCOUNTER — Ambulatory Visit: Payer: Medicare Other

## 2018-06-20 DIAGNOSIS — M6281 Muscle weakness (generalized): Secondary | ICD-10-CM | POA: Diagnosis not present

## 2018-06-20 DIAGNOSIS — R262 Difficulty in walking, not elsewhere classified: Secondary | ICD-10-CM

## 2018-06-20 DIAGNOSIS — R2681 Unsteadiness on feet: Secondary | ICD-10-CM

## 2018-06-20 NOTE — Therapy (Signed)
[place] MAIN [facility] SERVICES [address] [place], [place], [address] Phone: [phone]   Fax:  [phone]  Physical Therapy Treatment  Patient Details  Name: Brittany Duran MRN: [id number] Date of Birth: 1951/05/06 Referring Provider (PT): Tawni Carnes, MD/ Elayne Guerin, MD   Encounter Date: 06/20/2018  PT End of Session - 06/20/18 1039    Visit Number  19    Number of Visits  23    Date for PT Re-Evaluation  07/08/18    Authorization Type  last goals: 05/13/18, eval 04/08/18    PT Start Time  0925    PT Stop Time  1020    PT Time Calculation (min)  55 min    Equipment Utilized During Treatment  Gait belt    Activity Tolerance  Patient tolerated treatment well    Behavior During Therapy  [facility] for tasks assessed/performed       Past Medical History:  Diagnosis Date  . Cancer ([place])   . Hypertension     Past Surgical History:  Procedure Laterality Date  . BREAST SURGERY    . MASTECTOMY      There were no vitals filed for this visit.  Subjective Assessment - 06/20/18 0929    Subjective  Patient states she is doing well today. No pain upon arrival. HEP is going well. Went to orthopedist who took plain film radiographs which were negative for fracture. MRI ordered to rule out stress reaction or bone bruise.     Pertinent History  68 yo Female reports increased weakness in BLE; Patient had lumbar discectomy surgery in July 2019; Upon waking, patient was unable to move legs and was in the hospital for a week; She was discharged to short term rehab and came down with MRSA; patient underwent a second surgery in August 2019 at [facility]; She has had home health PT; Patient is now being referred to outpatient PT for weakness; She continues to have numbness in BLE to feet; She is ambulating with RW all the time; Patient reports having impaired proprioception and impaired gait mechanics; She reports recent falls due to LLE buckling  (last fall was end of Oct 2019); She reports that now she is having better quad control with less buckling;     How long can you sit comfortably?  30 min;     How long can you stand comfortably?  15-20 min;     How long can you walk comfortably?  40 min;     Diagnostic tests  X-rays shows large disc bulge at L4-L5    Patient Stated Goals  "I want to be able to walk better and get stronger" reduce fear of falling;     Currently in Pain?  No/denies          TREATMENT   Ther-ex Treadmill walking at beginning of session challenging step length and varying speed; Hooklying bridges 2 x 10; Hooklying bridges with alternating LE marching x 10 each Hooklying SLR 2 x 10 bilateral; Supine SLR hip abduction with manual resistance 2 x 10; Hooklying R single leg press with manual resistance by therapist x 10; Sit to stand from low mat table 2 x 10, no reports of LLE pain; Hooklying resisted lumbar rotation with force provided by therapist at the knees 3s hold 2 x 10 each direction; Hooklying pball press-down into knees for abdominal engagement 3s hold x 10; Hooklying SAQ over bolster with manual resistance x 10 bilateral;   Pt  educated throughout session about proper posture and technique with exercises. Improved exercise technique, movement at target joints, use of target muscles after min to mod verbal, visual, tactile cues.   Ptdemonstratesgood motivation during session. She has an MRI next week for her L tibia to rule out bone bruise/stress fracture. She denies any increase in L tibia pain during exercise today. Avoided leg press to minimize discomfort. Pt continues to demonstrate deficits in hip strength in multiple directions. She will need updated outcome measures/goals at next session as well as a progress note. Will allow pain to guide therapy sessions especially until she has her MRI. Willcontinue to progress balance, strength, and gait at follow-up sessions.She willbenefit  from additional skilled PT intervention to improve strength, balance and gait safety.                       PT Short Term Goals - 05/13/18 0905      PT SHORT TERM GOAL #1   Title  Patient will be adherent to HEP at least 3x a week to improve functional strength and balance for better safety at home.    Time  2    Period  Weeks    Status  On-going    Target Date  06/10/18      PT SHORT TERM GOAL #2   Title  Patient will improve lumbar AROM: flexion >30 degrees, lateral flexion >15 degrees bilaterally to improve functional ROM when picking up items from floor;     Baseline  05/13/18: flexion: 25 degrees, R lateral flexion: 29, L lateral flexion: 20    Time  2    Period  Weeks    Status  Partially Met    Target Date  06/10/18        PT Long Term Goals - 05/13/18 0906      PT LONG TERM GOAL #1   Title  Patient (> 53 years old) will complete five times sit to stand test in < 15 seconds indicating an increased LE strength and improved balance.    Baseline  05/13/18: 12.7 seconds    Time  6    Period  Weeks    Status  Achieved      PT LONG TERM GOAL #2   Title  Patient will increase Berg Balance score by > 6 points to demonstrate decreased fall risk during functional activities.    Baseline  04/08/18: 32/56, 05/13/18: 47/56    Time  6    Period  Weeks    Status  Achieved      PT LONG TERM GOAL #3   Title  Patient will increase six minute walk test distance to >1000 for progression to community ambulator and improve gait ability    Baseline  05/13/18: 1100'    Time  6    Period  Weeks    Status  Achieved      PT LONG TERM GOAL #4   Title  Patient will increase BLE gross strength to 4+/5 as to improve functional strength for independent gait, increased standing tolerance and increased ADL ability.    Baseline  05/13/18: (R/L) Hip extension: 4-/4, Hip abduciton: 4/4-, Hip adduction: 4+/3+, Ankle DF: 4-/4-    Time  6    Period  Weeks    Status  Partially Met     Target Date  07/08/18      PT LONG TERM GOAL #5   Title  Pt will improve BERG by  at least 3 points in order to demonstrate clinically significant improvement in balance.     Baseline  05/13/18: 47/56    Time  8    Period  Weeks    Status  New    Target Date  07/08/18      Additional Long Term Goals   Additional Long Term Goals  Yes      PT LONG TERM GOAL #6   Title  Pt will increase 6MWT by at least 57m(1613f in order to demonstrate clinically significant improvement in cardiopulmonary endurance and community ambulation     Baseline  05/13/18: 1100;    Time  8    Period  Weeks    Status  New    Target Date  07/08/18            Plan - 06/20/18 1040    Clinical Impression Statement  Ptdemonstratesgood motivation during session. She has an MRI next week for her L tibia to rule out bone bruise/stress fracture. She denies any increase in L tibia pain during exercise today. Avoided leg press to minimize discomfort. Pt continues to demonstrate deficits in hip strength in multiple directions. She will need updated outcome measures/goals at next session as well as a progress note. Will allow pain to guide therapy sessions especially until she has her MRI. Willcontinue to progress balance, strength, and gait at follow-up sessions.She willbenefit from additional skilled PT intervention to improve strength, balance and gait safety.    Rehab Potential  Good    Clinical Impairments Affecting Rehab Potential  patient highly motivated and committed to physical fitness, already attending silver sneakers class    PT Frequency  2x / week    PT Duration  8 weeks    PT Treatment/Interventions  Cryotherapy;Electrical Stimulation;Moist Heat;Gait training;DME Instruction;Stair training;Functional mobility training;Therapeutic activities;Therapeutic exercise;Balance training;Neuromuscular re-education;Patient/family education;Manual techniques;Energy conservation;Dry needling    PT Next Visit Plan   Update outcome measures/goals, progress note, continue with strength, balance, and gait training    PT Home Exercise Plan  Continue with HEP and Silver sneakers    Consulted and Agree with Plan of Care  Patient       Patient will benefit from skilled therapeutic intervention in order to improve the following deficits and impairments:  Abnormal gait, Decreased endurance, Hypomobility, Decreased activity tolerance, Decreased strength, Pain, Difficulty walking, Decreased mobility, Decreased balance, Decreased range of motion, Improper body mechanics, Decreased safety awareness  Visit Diagnosis: Muscle weakness (generalized)  Difficulty in walking, not elsewhere classified  Unsteadiness on feet     Problem List There are no active problems to display for this patient.  JaPhillips GroutT, DPT, GCS  , 06/20/2018, 11:59 AM  [place] [facility] [address] [place]: [phone] Fax:  [phone]: ViDIORA BELLIZZIRN: [id number] of Birth: [birth year]

## 2018-06-25 ENCOUNTER — Ambulatory Visit: Payer: Medicare Other

## 2018-06-25 DIAGNOSIS — M6281 Muscle weakness (generalized): Secondary | ICD-10-CM

## 2018-06-25 DIAGNOSIS — R262 Difficulty in walking, not elsewhere classified: Secondary | ICD-10-CM

## 2018-06-25 DIAGNOSIS — R2681 Unsteadiness on feet: Secondary | ICD-10-CM

## 2018-06-25 NOTE — Therapy (Signed)
[place] MAIN [facility] SERVICES [address] [place], [place], [address] Phone: [phone]   Fax:  [phone]  Physical Therapy Progress Note   Dates of reporting period  05/22/18   to   06/25/18  Patient Details  Name: Brittany Duran MRN: [id number] Date of Birth: 12-26-1950 Referring Provider (PT): Tawni Carnes, MD/ Elayne Guerin, MD   Encounter Date: 06/25/2018  PT End of Session - 06/25/18 1723    Visit Number  20    Number of Visits  23    Date for PT Re-Evaluation  07/08/18    Authorization Type  last goals: 05/13/18, updated 06/25/18, eval 04/08/18    PT Start Time  0932    PT Stop Time  1020    PT Time Calculation (min)  48 min    Equipment Utilized During Treatment  Gait belt    Activity Tolerance  Patient tolerated treatment well    Behavior During Therapy  [facility] for tasks assessed/performed       Past Medical History:  Diagnosis Date  . Cancer ([place])   . Hypertension     Past Surgical History:  Procedure Laterality Date  . BREAST SURGERY    . MASTECTOMY      There were no vitals filed for this visit.  Subjective Assessment - 06/25/18 0941    Subjective  Patient states she is doing well today. She is having some pain in her L knee upon arrival. She rates pain as 4/10 at this time. She sees Dr. Melrose Nakayama on Thursday and then has an MRI of her LLE on Monday. She has a follow-up with orthopedics that following Friday.    Pertinent History  68 yo Female reports increased weakness in BLE; Patient had lumbar discectomy surgery in July 2019; Upon waking, patient was unable to move legs and was in the hospital for a week; She was discharged to short term rehab and came down with MRSA; patient underwent a second surgery in August 2019 at [facility]; She has had home health PT; Patient is now being referred to outpatient PT for weakness; She continues to have numbness in BLE to feet; She is ambulating with RW all the time; Patient  reports having impaired proprioception and impaired gait mechanics; She reports recent falls due to LLE buckling (last fall was end of Oct 2019); She reports that now she is having better quad control with less buckling;     How long can you sit comfortably?  30 min;     How long can you stand comfortably?  15-20 min;     How long can you walk comfortably?  40 min;     Diagnostic tests  X-rays shows large disc bulge at L4-L5    Patient Stated Goals  "I want to be able to walk better and get stronger" reduce fear of falling;     Currently in Pain?  Yes    Pain Score  4     Pain Location  Leg    Pain Orientation  Left    Pain Descriptors / Indicators  Burning    Pain Type  Chronic pain    Pain Onset  More than a month ago         [facility] PT Assessment - 06/25/18 1007      Berg Balance Test   Sit to Stand  Able to stand without using hands and stabilize independently    Standing Unsupported  Able to stand safely  2 minutes    Sitting with Back Unsupported but Feet Supported on Floor or Stool  Able to sit safely and securely 2 minutes    Stand to Sit  Sits safely with minimal use of hands    Transfers  Able to transfer safely, definite need of hands    Standing Unsupported with Eyes Closed  Able to stand 10 seconds safely    Standing Ubsupported with Feet Together  Able to place feet together independently and stand 1 minute safely    From Standing, Reach Forward with Outstretched Arm  Can reach confidently >25 cm (10")    From Standing Position, Pick up Object from Floor  Able to pick up shoe, needs supervision    From Standing Position, Turn to Look Behind Over each Shoulder  Looks behind from both sides and weight shifts well    Turn 360 Degrees  Able to turn 360 degrees safely in 4 seconds or less    Standing Unsupported, Alternately Place Feet on Step/Stool  Able to stand independently and safely and complete 8 steps in 20 seconds    Standing Unsupported, One Foot in Front  Able to plae  foot ahead of the other independently and hold 30 seconds    Standing on One Leg  Able to lift leg independently and hold 5-10 seconds    Total Score  52          TREATMENT   Ther-ex Performed outcome measures with patient including: AROM of lumbar spine (see readings below); MMT of BLE (see readings below),  5TSTS: 11.0s, 12.7s,  BERG: 52/56, 47/56 6MWT: 1280'/1100' HR 120 bpm following  Lumbar AROM Lumbar flexion: 25 degrees; Lateral lumbar flexion: L:20, R: 22  Strength R/L 5/4 Hip flexion 5/5 Hip external rotation 5/5 Hip internal rotation 4-/4 Hip extension  4/4- Hip abduction 4+/3+ Hip adduction 5/5 Knee extension 5/4+ Knee flexion 4/4 Ankle Dorsiflexion *indicates pain   Pt educated throughout session about proper technique with outcome measures. Min to mod verbal, visual, tactile cues.    Pt is making excellent progress with therapy. Her BERG improved from 32/56 at initial evaluation to 47/56 on 05/13/18 and is 52/56 today. Her LE strength has been essentially unchanged to MMT since 05/13/18 with the exception of some improvement in bilateral ankle dorsiflexion. Her forward and lateral lumbar flexion is unchanged. She was unable to perform a sit to stand without UE support at initial evaluation. She was able to perform 5TSTS in 12.7 seconds on 05/13/18 without UE support and in 11.0s today. Her Six Minute Walk Test improved from 880' to 1100 and today it is 1280.' Pt will benefit from PT services to address deficits in strength, balance, and mobility in order to return to full function at home.                        PT Short Term Goals - 06/25/18 1724      PT SHORT TERM GOAL #1   Title  Patient will be adherent to HEP at least 3x a week to improve functional strength and balance for better safety at home.    Time  2    Period  Weeks    Status  Achieved    Target Date  06/10/18      PT SHORT TERM GOAL #2   Title  Patient will improve  lumbar AROM: flexion >30 degrees, lateral flexion >15 degrees bilaterally to improve functional ROM when picking  up items from floor;     Baseline  05/13/18: flexion: 25 degrees, R lateral flexion: 29, L lateral flexion: 20; 06/25/18: flexion: 25 degrees, R lateral flexion: 22, L lateral flexion: 20;     Time  2    Period  Weeks    Status  Partially Met    Target Date  06/10/18        PT Long Term Goals - 06/25/18 1725      PT LONG TERM GOAL #1   Title  Patient (> 79 years old) will complete five times sit to stand test in < 15 seconds indicating an increased LE strength and improved balance.    Baseline  05/13/18: 12.7 seconds; 06/25/18: 11.0s    Time  6    Period  Weeks    Status  Achieved      PT LONG TERM GOAL #2   Title  Patient will increase Berg Balance score by > 6 points to demonstrate decreased fall risk during functional activities.    Baseline  04/08/18: 32/56, 05/13/18: 47/56; 06/25/18: 52/56    Time  6    Period  Weeks    Status  Achieved      PT LONG TERM GOAL #3   Title  Patient will increase six minute walk test distance to >1000 for progression to community ambulator and improve gait ability    Baseline  05/13/18: 1100'; 06/25/18: 1280';    Time  6    Period  Weeks    Status  Achieved      PT LONG TERM GOAL #4   Title  Patient will increase BLE gross strength to 4+/5 as to improve functional strength for independent gait, increased standing tolerance and increased ADL ability.    Baseline  05/13/18: (R/L) Hip extension: 4-/4, Hip abduciton: 4/4-, Hip adduction: 4+/3+, Ankle DF: 4-/4-; 06/25/18: (R/L) Hip extension: 4-/4, Hip abduciton: 4/4-, Hip adduction: 4+/3+, Ankle DF: 4/4    Time  6    Period  Weeks    Status  Partially Met    Target Date  07/08/18      PT LONG TERM GOAL #5   Title  Pt will improve BERG by at least 3 points in order to demonstrate clinically significant improvement in balance.     Baseline  05/13/18: 47/56; 06/25/18: 52/56    Time  8    Period  Weeks     Status  Achieved      PT LONG TERM GOAL #6   Title  Pt will increase 6MWT by at least 14m(1643f in order to demonstrate clinically significant improvement in cardiopulmonary endurance and community ambulation     Baseline  05/13/18: 1100; 06/25/18: 1280'    Time  8    Period  Weeks    Status  Achieved            Plan - 06/25/18 1724    Clinical Impression Statement  Pt is making excellent progress with therapy. Her BERG improved from 32/56 at initial evaluation to 47/56 on 05/13/18 and is 52/56 today. Her LE strength has been essentially unchanged to MMT since 05/13/18 with the exception of some improvement in bilateral ankle dorsiflexion. Her forward and lateral lumbar flexion is unchanged. She was unable to perform a sit to stand without UE support at initial evaluation. She was able to perform 5TSTS in 12.7 seconds on 05/13/18 without UE support and in 11.0s today. Her Six Minute Walk Test improved from 880' to  1100 and today it is 1280.' Pt will benefit from PT services to address deficits in strength, balance, and mobility in order to return to full function at home.     Rehab Potential  Good    Clinical Impairments Affecting Rehab Potential  patient highly motivated and committed to physical fitness, already attending silver sneakers class    PT Frequency  2x / week    PT Duration  8 weeks    PT Treatment/Interventions  Cryotherapy;Electrical Stimulation;Moist Heat;Gait training;DME Instruction;Stair training;Functional mobility training;Therapeutic activities;Therapeutic exercise;Balance training;Neuromuscular re-education;Patient/family education;Manual techniques;Energy conservation;Dry needling    PT Next Visit Plan  Continue with strength, balance, and gait training    PT Home Exercise Plan  Continue with HEP and Silver sneakers    Consulted and Agree with Plan of Care  Patient       Patient will benefit from skilled therapeutic intervention in order to improve the following  deficits and impairments:  Abnormal gait, Decreased endurance, Hypomobility, Decreased activity tolerance, Decreased strength, Pain, Difficulty walking, Decreased mobility, Decreased balance, Decreased range of motion, Improper body mechanics, Decreased safety awareness  Visit Diagnosis: Muscle weakness (generalized)  Difficulty in walking, not elsewhere classified  Unsteadiness on feet     Problem List There are no active problems to display for this patient.  Phillips Grout PT, DPT, GCS  Amesha Bailey 06/25/2018, 5:34 PM  [place] MAIN [facility] SERVICES [address] [place], [place], [address] Phone: [phone]   Fax:  [phone]  Name: AMAYA BLAKEMAN MRN: [id number] Date of Birth: Mar 10, 1951

## 2018-07-02 ENCOUNTER — Ambulatory Visit: Payer: Medicare Other

## 2018-07-02 DIAGNOSIS — M6281 Muscle weakness (generalized): Secondary | ICD-10-CM | POA: Diagnosis not present

## 2018-07-02 DIAGNOSIS — R262 Difficulty in walking, not elsewhere classified: Secondary | ICD-10-CM

## 2018-07-02 DIAGNOSIS — R2681 Unsteadiness on feet: Secondary | ICD-10-CM

## 2018-07-02 NOTE — Therapy (Signed)
[place] MAIN [facility] SERVICES [address] [place], [place], [address] Phone: [phone]   Fax:  [phone]  Physical Therapy Treatment  Patient Details  Name: DEZYRAE KENSINGER MRN: [id number] Date of Birth: 07-02-1950 Referring Provider (PT): Tawni Carnes, MD/ Elayne Guerin, MD   Encounter Date: 07/02/2018  PT End of Session - 07/02/18 0952    Visit Number  21    Number of Visits  23    Date for PT Re-Evaluation  07/08/18    Authorization Type  last goals: 06/25/18, eval 04/08/18    PT Start Time  0930    PT Stop Time  1015    PT Time Calculation (min)  45 min    Equipment Utilized During Treatment  Gait belt    Activity Tolerance  Patient tolerated treatment well    Behavior During Therapy  [facility] for tasks assessed/performed       Past Medical History:  Diagnosis Date  . Cancer ([place])   . Hypertension     Past Surgical History:  Procedure Laterality Date  . BREAST SURGERY    . MASTECTOMY      There were no vitals filed for this visit.  Subjective Assessment - 07/02/18 0951    Subjective  Patient states she is doing well today. She saw Dr. Melrose Nakayama with neurology last week. He advised her to start taking Baclofen to help with some of her muscle related pain/tone. In addition he is sending her for an EMG of her LLE and referring her to urology. She had the MRI of her LLE and has a follow-up appointment with orthopedics to discuss the result. No specific questions upon arrival.    Pertinent History  68 yo Female reports increased weakness in BLE; Patient had lumbar discectomy surgery in July 2019; Upon waking, patient was unable to move legs and was in the hospital for a week; She was discharged to short term rehab and came down with MRSA; patient underwent a second surgery in August 2019 at [facility]; She has had home health PT; Patient is now being referred to outpatient PT for weakness; She continues to have numbness in BLE to  feet; She is ambulating with RW all the time; Patient reports having impaired proprioception and impaired gait mechanics; She reports recent falls due to LLE buckling (last fall was end of Oct 2019); She reports that now she is having better quad control with less buckling;     How long can you sit comfortably?  30 min;     How long can you stand comfortably?  15-20 min;     How long can you walk comfortably?  40 min;     Diagnostic tests  X-rays shows large disc bulge at L4-L5    Patient Stated Goals  "I want to be able to walk better and get stronger" reduce fear of falling;     Currently in Pain?  No/denies       TREATMENT   Ther-ex Octane HIIT L8 x 30s, L4 x 60s for 5 minutes at start of session during history (3 minutes unbilled); Hooklying bridges 2 x 10; Hooklying SLR 2 x 10 bilateral; Hooklying clams with manual resistance 2 x 10; Hooklying adductor squeeze with manual resistance 2 x 10; Hooklying resisted lumbar rotation with force provided by therapist at the knees 3s hold x 10 each direction; Sidelying SLR abduction x 10 bilateral; Sidelying clams with manual resistance x 10 bilateral; Sit to stand from  low mat table x 10, no reports of LLE pain; Education regarding EMG study and consult by pelvic health PT;   Pt educated throughout session about proper posture and technique with exercises. Improved exercise technique, movement at target joints, use of target muscles after min to mod verbal, visual, tactile cues.   Ptdemonstratesgood motivation during session.Pt was unable to fill her Baclofen due to the cost. Called pharmacy and they stated that '10mg'$  tablets are considerably cheaper. Pt encouraged to contact neurology to see if he would be able to prescribe a different dosage that could be divided in order to decrease cost. She is able to complete all exercises during session with notable weakness in LLE. Pt scheduled to see pelvic health therapist next week for  consult due to urinary retention. She will need a re certification at next visit. Willcontinue to progress balance, strength, and gait at follow-up sessions.She willbenefit from additional skilled PT intervention to improve strength, balance and gait safety.                          PT Short Term Goals - 06/25/18 1724      PT SHORT TERM GOAL #1   Title  Patient will be adherent to HEP at least 3x a week to improve functional strength and balance for better safety at home.    Time  2    Period  Weeks    Status  Achieved    Target Date  06/10/18      PT SHORT TERM GOAL #2   Title  Patient will improve lumbar AROM: flexion >30 degrees, lateral flexion >15 degrees bilaterally to improve functional ROM when picking up items from floor;     Baseline  05/13/18: flexion: 25 degrees, R lateral flexion: 29, L lateral flexion: 20; 06/25/18: flexion: 25 degrees, R lateral flexion: 22, L lateral flexion: 20;     Time  2    Period  Weeks    Status  Partially Met    Target Date  06/10/18        PT Long Term Goals - 06/25/18 1725      PT LONG TERM GOAL #1   Title  Patient (> 53 years old) will complete five times sit to stand test in < 15 seconds indicating an increased LE strength and improved balance.    Baseline  05/13/18: 12.7 seconds; 06/25/18: 11.0s    Time  6    Period  Weeks    Status  Achieved      PT LONG TERM GOAL #2   Title  Patient will increase Berg Balance score by > 6 points to demonstrate decreased fall risk during functional activities.    Baseline  04/08/18: 32/56, 05/13/18: 47/56; 06/25/18: 52/56    Time  6    Period  Weeks    Status  Achieved      PT LONG TERM GOAL #3   Title  Patient will increase six minute walk test distance to >1000 for progression to community ambulator and improve gait ability    Baseline  05/13/18: 1100'; 06/25/18: 1280';    Time  6    Period  Weeks    Status  Achieved      PT LONG TERM GOAL #4   Title  Patient will increase  BLE gross strength to 4+/5 as to improve functional strength for independent gait, increased standing tolerance and increased ADL ability.    Baseline  05/13/18: (  R/L) Hip extension: 4-/4, Hip abduciton: 4/4-, Hip adduction: 4+/3+, Ankle DF: 4-/4-; 06/25/18: (R/L) Hip extension: 4-/4, Hip abduciton: 4/4-, Hip adduction: 4+/3+, Ankle DF: 4/4    Time  6    Period  Weeks    Status  Partially Met    Target Date  07/08/18      PT LONG TERM GOAL #5   Title  Pt will improve BERG by at least 3 points in order to demonstrate clinically significant improvement in balance.     Baseline  05/13/18: 47/56; 06/25/18: 52/56    Time  8    Period  Weeks    Status  Achieved      PT LONG TERM GOAL #6   Title  Pt will increase 6MWT by at least 66m(1664f in order to demonstrate clinically significant improvement in cardiopulmonary endurance and community ambulation     Baseline  05/13/18: 1100; 06/25/18: 1280'    Time  8    Period  Weeks    Status  Achieved            Plan - 07/02/18 1016    Clinical Impression Statement  Ptdemonstratesgood motivation during session.Pt was unable to fill her Baclofen due to the cost. Called pharmacy and they stated that '10mg'$  tablets are considerably cheaper. Pt encouraged to contact neurology to see if he would be able to prescribe a different dosage that could be divided in order to decrease cost. She is able to complete all exercises during session with notable weakness in LLE. Pt scheduled to see pelvic health therapist next week for consult due to urinary retention. She will need a re certification at next visit. Willcontinue to progress balance, strength, and gait at follow-up sessions.She willbenefit from additional skilled PT intervention to improve strength, balance and gait safety.    Rehab Potential  Good    Clinical Impairments Affecting Rehab Potential  patient highly motivated and committed to physical fitness, already attending silver sneakers class    PT  Frequency  2x / week    PT Duration  8 weeks    PT Treatment/Interventions  Cryotherapy;Electrical Stimulation;Moist Heat;Gait training;DME Instruction;Stair training;Functional mobility training;Therapeutic activities;Therapeutic exercise;Balance training;Neuromuscular re-education;Patient/family education;Manual techniques;Energy conservation;Dry needling    PT Next Visit Plan  Recertification, Continue with strength, balance, and gait training    PT Home Exercise Plan  Continue with HEP and Silver sneakers    Consulted and Agree with Plan of Care  Patient       Patient will benefit from skilled therapeutic intervention in order to improve the following deficits and impairments:  Abnormal gait, Decreased endurance, Hypomobility, Decreased activity tolerance, Decreased strength, Pain, Difficulty walking, Decreased mobility, Decreased balance, Decreased range of motion, Improper body mechanics, Decreased safety awareness  Visit Diagnosis: Muscle weakness (generalized)  Difficulty in walking, not elsewhere classified  Unsteadiness on feet     Problem List There are no active problems to display for this patient.  JaPhillips GroutT, DPT, GCS  , 07/02/2018, 3:49 PM  [facility]ERVICES [address]one: [phone] Fax:  [phone]: ViCHARIE PINKUSRN: [id number] of Birth: [birth year]

## 2018-07-04 ENCOUNTER — Ambulatory Visit: Payer: Medicare Other

## 2018-07-04 VITALS — BP 152/77 | HR 80

## 2018-07-04 DIAGNOSIS — R32 Unspecified urinary incontinence: Secondary | ICD-10-CM

## 2018-07-04 DIAGNOSIS — M6281 Muscle weakness (generalized): Secondary | ICD-10-CM | POA: Diagnosis not present

## 2018-07-04 DIAGNOSIS — R2681 Unsteadiness on feet: Secondary | ICD-10-CM

## 2018-07-04 DIAGNOSIS — R262 Difficulty in walking, not elsewhere classified: Secondary | ICD-10-CM

## 2018-07-04 NOTE — Therapy (Signed)
[place] MAIN [facility] SERVICES [address] [place], [place], [address] Phone: [phone]   Fax:  [phone]  Physical Therapy Treatment/Recertification  Patient Details  Name: Brittany Duran MRN: [id number] Date of Birth: [birth year] Referring Provider (PT): Tawni Carnes, MD/ Elayne Guerin, MD   Encounter Date: 07/04/2018  PT End of Session - 07/04/18 0938    Visit Number  22    Number of Visits  39    Date for PT Re-Evaluation  08/29/18    Authorization Type  last goals: 06/25/18, eval 04/08/18    PT Start Time  0932    PT Stop Time  1015    PT Time Calculation (min)  43 min    Equipment Utilized During Treatment  Gait belt    Activity Tolerance  Patient tolerated treatment well    Behavior During Therapy  [facility] for tasks assessed/performed       Past Medical History:  Diagnosis Date  . Cancer ([place])   . Hypertension     Past Surgical History:  Procedure Laterality Date  . BREAST SURGERY    . MASTECTOMY      Vitals:   07/04/18 0936  BP: (!) 152/77  Pulse: 80  SpO2: 100%    Subjective Assessment - 07/04/18 0937    Subjective  Pt reports that she is doing well on this date. No specific questions or concerns upon arrival. Has FU appointment tomorrow with orthopedics to discuss her MRI. No pain reported upon arrival today.     Pertinent History  68 yo Female reports increased weakness in BLE; Patient had lumbar discectomy surgery in July 2019; Upon waking, patient was unable to move legs and was in the hospital for a week; She was discharged to short term rehab and came down with MRSA; patient underwent a second surgery in August 2019 at [facility]; She has had home health PT; Patient is now being referred to outpatient PT for weakness; She continues to have numbness in BLE to feet; She is ambulating with RW all the time; Patient reports having impaired proprioception and impaired gait mechanics; She reports recent falls due  to LLE buckling (last fall was end of Oct 2019); She reports that now she is having better quad control with less buckling;     How long can you sit comfortably?  30 min;     How long can you stand comfortably?  15-20 min;     How long can you walk comfortably?  40 min;     Diagnostic tests  X-rays shows large disc bulge at L4-L5    Patient Stated Goals  "I want to be able to walk better and get stronger" reduce fear of falling;     Currently in Pain?  No/denies           TREATMENT   Ther-ex Octane HIIT L10 x 30s, L5 x 60s for 5 minutes at start of session during history (3 minutes unbilled); Sit to stand without UE support from regular height chair with 5" step under LLE; Step-ups to 5" step with bilateral/single UE support alternating leading LE x 10 each side; Pball wall squats with hold x 10s x 10 to depth of patient tolerance;   Gait Training Gait training with patient utilizing lofstrand crutches'. Performed gait speed changes on cue during ambulation. Pt challengedwith dual tasking conversation during ambulation as well as horizontal and vertical head turns. No buckling or overt LOBobserved. Pt has one stumble after  tripping over poorly place lofstrand crutch.   Neuromuscular Re-education  Toe taps to 4" step without UE support alternating LE x 10 each; Airex toe taps to 4" step without UE support alternating LE x 10 each; Airex semitandem balance alternating forward LE x 30s each; 1/2 foam roll (flat side up) tandem balance alternating forward LE x 30s each;   Pt educated throughout session about proper posture and technique with exercises. Improved exercise technique, movement at target joints, use of target muscles after min to mod verbal, visual, tactile cues.   Pt is making excellent progress with therapy. Her BERG improved from 32/56 at initial evaluation to 47/56 on 05/13/18 and 52/56 on 06/25/18. Her Corliss Blacker has been essentially unchanged to MMT since  05/13/18 with the exception of some improvement in bilateral ankle dorsiflexion. Her forward and lateral lumbar flexion is unchanged. She was unable to perform a sit to stand without UE support at initial evaluation. She was able to perform 5TSTS in 12.7 seconds on 05/13/18 without UE support and in 11.0s on 06/25/18. Her Six Minute Walk Test improved from 20' to 1100' and finally 1280' on 06/25/18. She continues to present with considerable LLE weakness. She sess orthopedics tomorrow to follow-up regarding MRI results for LLE tibial pain. She also has EMG study scheduled for LLE as ordered by neurology as well as urology appointment due to urinary retention. She will see pelvic health PT for her next visit to discuss her urinary issues. She will benefit from PT services to address deficits in strength, balance, and mobility in order to return to full function at home.                         PT Short Term Goals - 06/25/18 1724      PT SHORT TERM GOAL #1   Title  Patient will be adherent to HEP at least 3x a week to improve functional strength and balance for better safety at home.    Time  2    Period  Weeks    Status  Achieved    Target Date  06/10/18      PT SHORT TERM GOAL #2   Title  Patient will improve lumbar AROM: flexion >30 degrees, lateral flexion >15 degrees bilaterally to improve functional ROM when picking up items from floor;     Baseline  05/13/18: flexion: 25 degrees, R lateral flexion: 29, L lateral flexion: 20; 06/25/18: flexion: 25 degrees, R lateral flexion: 22, L lateral flexion: 20;     Time  2    Period  Weeks    Status  Partially Met    Target Date  06/10/18        PT Long Term Goals - 06/25/18 1725      PT LONG TERM GOAL #1   Title  Patient (> 7 years old) will complete five times sit to stand test in < 15 seconds indicating an increased LE strength and improved balance.    Baseline  05/13/18: 12.7 seconds; 06/25/18: 11.0s    Time  6    Period   Weeks    Status  Achieved      PT LONG TERM GOAL #2   Title  Patient will increase Berg Balance score by > 6 points to demonstrate decreased fall risk during functional activities.    Baseline  04/08/18: 32/56, 05/13/18: 47/56; 06/25/18: 52/56    Time  6    Period  [place]  Status  Achieved      PT LONG TERM GOAL #3   Title  Patient will increase six minute walk test distance to >1000 for progression to community ambulator and improve gait ability    Baseline  05/13/18: 1100'; 06/25/18: 1280';    Time  6    Period  Weeks    Status  Achieved      PT LONG TERM GOAL #4   Title  Patient will increase BLE gross strength to 4+/5 as to improve functional strength for independent gait, increased standing tolerance and increased ADL ability.    Baseline  05/13/18: (R/L) Hip extension: 4-/4, Hip abduciton: 4/4-, Hip adduction: 4+/3+, Ankle DF: 4-/4-; 06/25/18: (R/L) Hip extension: 4-/4, Hip abduciton: 4/4-, Hip adduction: 4+/3+, Ankle DF: 4/4    Time  6    Period  Weeks    Status  Partially Met    Target Date  07/08/18      PT LONG TERM GOAL #5   Title  Pt will improve BERG by at least 3 points in order to demonstrate clinically significant improvement in balance.     Baseline  05/13/18: 47/56; 06/25/18: 52/56    Time  8    Period  Weeks    Status  Achieved      PT LONG TERM GOAL #6   Title  Pt will increase 6MWT by at least 22m(1621f in order to demonstrate clinically significant improvement in cardiopulmonary endurance and community ambulation     Baseline  05/13/18: 1100; 06/25/18: 1280'    Time  8    Period  Weeks    Status  Achieved            Plan - 07/04/18 [id number]  Clinical Impression Statement  Pt is making excellent progress with therapy. Her BERG improved from 32/56 at initial evaluation to 47/56 on 05/13/18 and 52/56 on 06/25/18. Her LECorliss Blackeras been essentially unchanged to MMT since 05/13/18 with the exception of some improvement in bilateral ankle dorsiflexion. Her forward and  lateral lumbar flexion is unchanged. She was unable to perform a sit to stand without UE support at initial evaluation. She was able to perform 5TSTS in 12.7 seconds on 05/13/18 without UE support and in 11.0s on 06/25/18. Her Six Minute Walk Test improved from 8849to 1100' and finally 1280' on 06/25/18. She continues to present with considerable LLE weakness. She sess orthopedics tomorrow to follow-up regarding MRI results for LLE tibial pain. She also has EMG study scheduled for LLE as ordered by neurology as well as urology appointment due to urinary retention. She will see pelvic health PT for her next visit to discuss her urinary issues. She will benefit from PT services to address deficits in strength, balance, and mobility in order to return to full function at home.    Rehab Potential  Good    Clinical Impairments Affecting Rehab Potential  patient highly motivated and committed to physical fitness, already attending silver sneakers class    PT Frequency  2x / week    PT Duration  8 weeks    PT Treatment/Interventions  Cryotherapy;Electrical Stimulation;Moist Heat;Gait training;DME Instruction;Stair training;Functional mobility training;Therapeutic activities;Therapeutic exercise;Balance training;Neuromuscular re-education;Patient/family education;Manual techniques;Energy conservation;Dry needling;ADLs/Self Care Home Management;Canalith Repostioning;Iontophoresis 83m83ml Dexamethasone;Traction;Ultrasound;Biofeedback;Aquatic Therapy;Passive range of motion;Cognitive remediation;Vestibular;Visual/perceptual remediation/compensation;Other (comment)   Pelvic floor therapy   PT Next Visit Plan  Pelvic therapy session, continue with strength, balance, and gait training    PT Home Exercise Plan  Continue with HEP  and Silver sneakers    Consulted and Agree with Plan of Care  Patient       Patient will benefit from skilled therapeutic intervention in order to improve the following deficits and impairments:   Abnormal gait, Decreased endurance, Hypomobility, Decreased activity tolerance, Decreased strength, Pain, Difficulty walking, Decreased mobility, Decreased balance, Decreased range of motion, Improper body mechanics, Decreased safety awareness, Increased muscle spasms  Visit Diagnosis: Muscle weakness (generalized)  Difficulty in walking, not elsewhere classified  Unsteadiness on feet  Urinary incontinence, unspecified type     Problem List There are no active problems to display for this patient.  Phillips Grout PT, DPT, GCS  Richie Bonanno 07/04/2018, 5:03 PM  [place] MAIN [facility] SERVICES [address] [place], [place], [address] Phone: [phone]   Fax:  [phone]  Name: LALENA SALAS MRN: [id number] Date of Birth: [birth year]

## 2018-07-09 ENCOUNTER — Ambulatory Visit: Payer: Medicare Other

## 2018-07-09 ENCOUNTER — Ambulatory Visit: Payer: Medicare Other | Attending: Family Medicine | Admitting: Physical Therapy

## 2018-07-09 DIAGNOSIS — M6281 Muscle weakness (generalized): Secondary | ICD-10-CM | POA: Diagnosis present

## 2018-07-09 DIAGNOSIS — R32 Unspecified urinary incontinence: Secondary | ICD-10-CM | POA: Diagnosis present

## 2018-07-09 DIAGNOSIS — R2689 Other abnormalities of gait and mobility: Secondary | ICD-10-CM | POA: Diagnosis not present

## 2018-07-09 DIAGNOSIS — R2681 Unsteadiness on feet: Secondary | ICD-10-CM | POA: Insufficient documentation

## 2018-07-09 DIAGNOSIS — R262 Difficulty in walking, not elsewhere classified: Secondary | ICD-10-CM | POA: Insufficient documentation

## 2018-07-09 NOTE — Patient Instructions (Signed)
Open book ( handout)   Deep core leve 1 and 2 ( handout)   ___  Inhalation ribs expansion/ pelvic floor lengthening during urination  Not chest breathing when urinating / bowel movements

## 2018-07-09 NOTE — Therapy (Signed)
[place] MAIN [facility] SERVICES [address] [place], [place], [address] Phone: [phone]   Fax:  [phone]  Physical Therapy Treatment  Patient Details  Name: Brittany Duran MRN: [id number] Date of Birth: 1950-11-27 Referring Provider (PT): Tawni Carnes, MD/ Elayne Guerin, MD   Encounter Date: 07/09/2018  PT End of Session - 07/09/18 1156    Visit Number  23    Number of Visits  39    Date for PT Re-Evaluation  08/29/18    Authorization Type  last goals: 06/25/18, eval 04/08/18    PT Start Time  1007    PT Stop Time  1110    PT Time Calculation (min)  63 min    Equipment Utilized During Treatment  Gait belt    Activity Tolerance  Patient tolerated treatment well    Behavior During Therapy  [facility] for tasks assessed/performed       Past Medical History:  Diagnosis Date  . Cancer ([place])   . Hypertension     Past Surgical History:  Procedure Laterality Date  . BREAST SURGERY    . MASTECTOMY      There were no vitals filed for this visit.  Subjective Assessment - 07/09/18 1010    Subjective  In 2007, pt had mastectomy on the R and node dissection on the R with chemo/ radiation. Pt had a transabdominal flap for the mastectomy.  Currently pt is taking a aromatase blocker.  After her lumbar surgery ( 1st in July) ( 2nd in Aug), pt had urinary retention.      Pertinent History  68 yo Female reports increased weakness in BLE; Patient had lumbar discectomy surgery in July 2019; Upon waking, patient was unable to move legs and was in the hospital for a week; She was discharged to short term rehab and came down with MRSA; patient underwent a second surgery in August 2019 at [facility]; She has had home health PT; Patient is now being referred to outpatient PT for weakness; She continues to have numbness in BLE to feet; She is ambulating with RW all the time; Patient reports having impaired proprioception and impaired gait mechanics; She  reports recent falls due to LLE buckling (last fall was end of Oct 2019); She reports that now she is having better quad control with less buckling;     How long can you sit comfortably?  30 min;     How long can you stand comfortably?  15-20 min;     How long can you walk comfortably?  40 min;     Diagnostic tests  X-rays shows large disc bulge at L4-L5    Patient Stated Goals  "I want to be able to walk better and get stronger" reduce fear of falling;          [facility] PT Assessment - 07/09/18 1021      Observation/Other Assessments   Observations  dermatome testing: L4/5, S1/2 decreased on L >R      Single Leg Stance   Comments  poor stability, single UE support on walker. pelvic shift       Palpation   Spinal mobility  hypomobility at thoracic T3-10 , medial scapular mm tight R > L     SI assessment   iliac crest levelled                Pelvic Floor Special Questions - 07/09/18 1102    External Perineal Exam  dyscoordination of pelvic  floor, chest breathing present with lift og pelvic floor on inhalation.  Corrected with cues , pt demo'd IND end of session         [place] [facility] PT Treatment/Exercise - 07/09/18 1101      Neuro Re-ed    Neuro Re-ed Details   excessive cues with tactile, verbal, and visual cues for stability of lower kinetic chain and posterior back with deep core level 1 -2 training.        Modalities   Modalities  Moist Heat      Moist Heat Therapy   Number Minutes Moist Heat  10 Minutes   during neuro-muscular training   Moist Heat Location  --   posterior trunk     Manual Therapy   Manual therapy comments  MWM/STM at throacic segments                PT Short Term Goals - 06/25/18 1724      PT SHORT TERM GOAL #1   Title  Patient will be adherent to HEP at least 3x a week to improve functional strength and balance for better safety at home.    Time  2    Period  Weeks    Status  Achieved    Target Date  06/10/18      PT SHORT  TERM GOAL #2   Title  Patient will improve lumbar AROM: flexion >30 degrees, lateral flexion >15 degrees bilaterally to improve functional ROM when picking up items from floor;     Baseline  05/13/18: flexion: 25 degrees, R lateral flexion: 29, L lateral flexion: 20; 06/25/18: flexion: 25 degrees, R lateral flexion: 22, L lateral flexion: 20;     Time  2    Period  Weeks    Status  Partially Met    Target Date  06/10/18        PT Long Term Goals - 06/25/18 1725      PT LONG TERM GOAL #1   Title  Patient (> 81 years old) will complete five times sit to stand test in < 15 seconds indicating an increased LE strength and improved balance.    Baseline  05/13/18: 12.7 seconds; 06/25/18: 11.0s    Time  6    Period  Weeks    Status  Achieved      PT LONG TERM GOAL #2   Title  Patient will increase Berg Balance score by > 6 points to demonstrate decreased fall risk during functional activities.    Baseline  04/08/18: 32/56, 05/13/18: 47/56; 06/25/18: 52/56    Time  6    Period  Weeks    Status  Achieved      PT LONG TERM GOAL #3   Title  Patient will increase six minute walk test distance to >1000 for progression to community ambulator and improve gait ability    Baseline  05/13/18: 1100'; 06/25/18: 1280';    Time  6    Period  Weeks    Status  Achieved      PT LONG TERM GOAL #4   Title  Patient will increase BLE gross strength to 4+/5 as to improve functional strength for independent gait, increased standing tolerance and increased ADL ability.    Baseline  05/13/18: (R/L) Hip extension: 4-/4, Hip abduciton: 4/4-, Hip adduction: 4+/3+, Ankle DF: 4-/4-; 06/25/18: (R/L) Hip extension: 4-/4, Hip abduciton: 4/4-, Hip adduction: 4+/3+, Ankle DF: 4/4    Time  6    Period  Weeks    Status  Partially Met    Target Date  07/08/18      PT LONG TERM GOAL #5   Title  Pt will improve BERG by at least 3 points in order to demonstrate clinically significant improvement in balance.     Baseline  05/13/18: 47/56;  06/25/18: 52/56    Time  8    Period  Weeks    Status  Achieved      PT LONG TERM GOAL #6   Title  Pt will increase 6MWT by at least 51m(1671f in order to demonstrate clinically significant improvement in cardiopulmonary endurance and community ambulation     Baseline  05/13/18: 1100; 06/25/18: 1280'    Time  8    Period  Weeks    Status  Achieved            Plan - 07/09/18 1015    Clinical Impression Statement Pelvic Physical Therapy assessment by pelvic specialist PT today showed pt with dyssynergia of pelvic floor mm which may likely contribute to urinary retention. Following manual Tx to improve diaphragmatic breathing, less chest breathing, pelvic floor coordination was corrected to lengthen with inhalation instead of contracted. Pt was explained anatomy/ physiology of deep core mm ( pelvic floor, diaphragm, deep abdominal mm) and the impact of her pervious surgeries on this system. Pt demo'd proper technique to lengthen pelvic floor following training and manual Tx to promote thoracic mobility. RW height was adjusted to lower height to minimize overuse of scapular mm which limited diaphragmatic/ pelvic floor excursion.  Advanced pt to deep core strengthening which will also promote postural stability for gait and balance. Educated pt to practice pelvic floor lengthening when toileting.   Plan for upcoming visits:  Further manual Tx will be applied to promote fascial mobility over scars related to transabdominal flap procedure  which included pulling abdominal tissue from the L to the R breast area. Plan to  address scar mobility over mastectomies and lumbar scar. Suspect this will lead to better outcomes for further strengthening of obliques mm as a progression from deep core strengthening. Plan to advance pt to thoracolumbar strengthening as well.   Plan to monitor how urinary retention improves with improved pelvic floor coordination / relaxation training. Factors to consider impacting  urinary retention include recent lumbar surgeries in 2019 with associated complications requiring 2 nd surgery 2/2/ MRSA.  Pt did not have urinary retention prior to lumbar surgery.    Pt continues to benefit from skilled PT.    Rehab Potential  Good    Clinical Impairments Affecting Rehab Potential  patient highly motivated and committed to physical fitness, already attending silver sneakers class    PT Frequency  2x / week    PT Duration  8 weeks    PT Treatment/Interventions  Cryotherapy;Electrical Stimulation;Moist Heat;Gait training;DME Instruction;Stair training;Functional mobility training;Therapeutic activities;Therapeutic exercise;Balance training;Neuromuscular re-education;Patient/family education;Manual techniques;Energy conservation;Dry needling;ADLs/Self Care Home Management;Canalith Repostioning;Iontophoresis 85m685ml Dexamethasone;Traction;Ultrasound;Biofeedback;Aquatic Therapy;Passive range of motion;Cognitive remediation;Vestibular;Visual/perceptual remediation/compensation;Other (comment)   Pelvic floor therapy   PT Next Visit Plan  Pelvic therapy session, continue with strength, balance, and gait training    PT Home Exercise Plan  Continue with HEP and Silver sneakers    Consulted and Agree with Plan of Care  Patient       Patient will benefit from skilled therapeutic intervention in order to improve the following deficits and impairments:  Abnormal gait, Decreased endurance, Hypomobility, Decreased activity tolerance, Decreased strength, Pain, Difficulty walking, Decreased mobility,  Decreased balance, Decreased range of motion, Improper body mechanics, Decreased safety awareness, Increased muscle spasms  Visit Diagnosis: Other abnormalities of gait and mobility     Problem List There are no active problems to display for this patient.   Jerl Mina  ,PT, DPT, E-RYT  07/09/2018, 11:58 AM  [place] MAIN [facility] SERVICES [address] [place], [place], [address] Phone: [phone]   Fax:  [phone]  Name: JIHAN MELLETTE MRN: [id number] Date of Birth: [birth year]

## 2018-07-11 ENCOUNTER — Ambulatory Visit: Payer: Medicare Other

## 2018-07-15 ENCOUNTER — Ambulatory Visit: Payer: Medicare Other | Admitting: Physical Therapy

## 2018-07-15 DIAGNOSIS — R2689 Other abnormalities of gait and mobility: Secondary | ICD-10-CM | POA: Diagnosis not present

## 2018-07-15 DIAGNOSIS — R2681 Unsteadiness on feet: Secondary | ICD-10-CM

## 2018-07-15 DIAGNOSIS — R262 Difficulty in walking, not elsewhere classified: Secondary | ICD-10-CM

## 2018-07-15 DIAGNOSIS — R32 Unspecified urinary incontinence: Secondary | ICD-10-CM

## 2018-07-15 DIAGNOSIS — M6281 Muscle weakness (generalized): Secondary | ICD-10-CM

## 2018-07-15 NOTE — Patient Instructions (Addendum)
Continue with deep core level 1 and 2 2 x day   To loosen R shoulder/ neck tightness : R Angel wing , up head,  Turn head to the L , look into armpit of L  Lower wing down shoulder blade down and back  10 reps x 2     _______     Avoid straining pelvic floor, abdominal muscles , spine  Use log rolling technique instead of getting out of bed with your neck or the sit-up     Log rolling into and out of bed If getting out of bed on L side, 1) Bent knees, scoot hips/ shoulder to R   Raise L arm completely overhead, rolling onto armpit  Then lower bent knees to bed to get into complete side lying position  Then drop legs off bed, and push up onto L elbow/forearm, and use R hand to push onto the bend  ___   Proper body mechanics with getting out of a chair to decrease strain  on back &pelvic floor   Avoid holding your breath when Getting out of the chair:  Scoot to front part of chair chair Heels behind feet, feet are hip width apart, nose over toes  Inhale like you are smelling roses Exhale to stand hands off the chair seat

## 2018-07-15 NOTE — Therapy (Addendum)
[place] MAIN [facility] SERVICES [address] [place], [place], [address] Phone: [phone]   Fax:  [phone]  Physical Therapy Treatment  Patient Details  Name: Brittany Duran MRN: [id number] Date of Birth: Mar 03, 1951 Referring Provider (PT): Tawni Carnes, MD/ Elayne Guerin, MD   Encounter Date: 07/15/2018  PT End of Session - 07/15/18 1513    Visit Number  24    Number of Visits  39    Date for PT Re-Evaluation  08/29/18    Authorization Type  last goals: 06/25/18, eval 04/08/18    PT Start Time  1510    PT Stop Time  1602    PT Time Calculation (min)  52 min    Equipment Utilized During Treatment  Gait belt    Activity Tolerance  Patient tolerated treatment well    Behavior During Therapy  [facility] for tasks assessed/performed       Past Medical History:  Diagnosis Date  . Cancer ([place])   . Hypertension     Past Surgical History:  Procedure Laterality Date  . BREAST SURGERY    . MASTECTOMY      There were no vitals filed for this visit.  Subjective Assessment - 07/15/18 1512    Subjective  Pt reported that when she paid attention to her breathing when toileting, she noticed it made a difference. Pt is not going as frequent because she is more attentive to when her bladder is full.     Pertinent History  68 yo Female reports increased weakness in BLE; Patient had lumbar discectomy surgery in July 2019; Upon waking, patient was unable to move legs and was in the hospital for a week; She was discharged to short term rehab and came down with MRSA; patient underwent a second surgery in August 2019 at [facility]; She has had home health PT; Patient is now being referred to outpatient PT for weakness; She continues to have numbness in BLE to feet; She is ambulating with RW all the time; Patient reports having impaired proprioception and impaired gait mechanics; She reports recent falls due to LLE buckling (last fall was end of Oct 2019);  She reports that now she is having better quad control with less buckling;     How long can you sit comfortably?  30 min;     How long can you stand comfortably?  15-20 min;     How long can you walk comfortably?  40 min;     Diagnostic tests  X-rays shows large disc bulge at L4-L5    Patient Stated Goals  "I want to be able to walk better and get stronger" reduce fear of falling;          [facility] PT Assessment - 07/15/18 1610      Coordination   Gross Motor Movements are Fluid and Coordinated  --    diaphragmatic excursion increased post Tx     Sit to Stand   Comments  3rd trial with body mechanics, without BUE support, difficulty on 3rd trial      Palpation   Spinal mobility  intercostal tightness ( anterior/ lateral ribs 10-12 ) scar restrictions tightenss under B breasts ( post Tx: improved mobility in these areas)      Palpation comment  R medial scapular mm attachments, SCM, scalenes posterior tight R ( decreased post Tx)        Bed Mobility   Bed Mobility  --   head crunch  [facility] PT Treatment/Exercise - 07/15/18 1610      Neuro Re-ed    Neuro Re-ed Details   cued for proper log rolling to minimize strain to neck. spine, pelvic floor. cued for improved lengthening through diaphragm when toileting        Modalities   Modalities  Moist Heat      Moist Heat Therapy   Number Minutes Moist Heat  10 Minutes      Manual Therapy   Manual therapy comments  MWM/STM at areas noted in assessment to faciliate lateral excursion of diaphragm/ thorax                PT Short Term Goals - 06/25/18 1724      PT SHORT TERM GOAL #1   Title  Patient will be adherent to HEP at least 3x a week to improve functional strength and balance for better safety at home.    Time  2    Period  Weeks    Status  Achieved    Target Date  06/10/18      PT SHORT TERM GOAL #2   Title  Patient will improve lumbar AROM: flexion >30 degrees, lateral flexion  >15 degrees bilaterally to improve functional ROM when picking up items from floor;     Baseline  05/13/18: flexion: 25 degrees, R lateral flexion: 29, L lateral flexion: 20; 06/25/18: flexion: 25 degrees, R lateral flexion: 22, L lateral flexion: 20;     Time  2    Period  Weeks    Status  Partially Met    Target Date  06/10/18        PT Long Term Goals - 07/15/18 1513      PT LONG TERM GOAL #1   Title  Patient (> 32 years old) will complete five times sit to stand test in < 15 seconds indicating an increased LE strength and improved balance.    Baseline  05/13/18: 12.7 seconds; 06/25/18: 11.0s    Time  6    Period  Weeks    Status  Achieved      PT LONG TERM GOAL #2   Title  Patient will increase Berg Balance score by > 6 points to demonstrate decreased fall risk during functional activities.    Baseline  04/08/18: 32/56, 05/13/18: 47/56; 06/25/18: 52/56    Time  6    Period  Weeks    Status  Achieved      PT LONG TERM GOAL #3   Title  Patient will increase six minute walk test distance to >1000 for progression to community ambulator and improve gait ability    Baseline  05/13/18: 1100'; 06/25/18: 1280';    Time  6    Period  Weeks    Status  Achieved      PT LONG TERM GOAL #4   Title  Patient will increase BLE gross strength to 4+/5 as to improve functional strength for independent gait, increased standing tolerance and increased ADL ability.    Baseline  05/13/18: (R/L) Hip extension: 4-/4, Hip abduciton: 4/4-, Hip adduction: 4+/3+, Ankle DF: 4-/4-; 06/25/18: (R/L) Hip extension: 4-/4, Hip abduciton: 4/4-, Hip adduction: 4+/3+, Ankle DF: 4/4    Time  6    Period  Weeks    Status  Partially Met      PT LONG TERM GOAL #5   Title  Pt will improve BERG by at least 3 points in order to demonstrate clinically significant improvement in  balance.     Baseline  05/13/18: 47/56; 06/25/18: 52/56    Time  8    Period  Weeks    Status  Achieved      Additional Long Term Goals   Additional Long  Term Goals  Yes      PT LONG TERM GOAL #6   Title  Pt will increase 6MWT by at least 31m(1616f in order to demonstrate clinically significant improvement in cardiopulmonary endurance and community ambulation     Baseline  05/13/18: 1100; 06/25/18: 1280'    Time  8    Period  Weeks    Status  Achieved      PT LONG TERM GOAL #7   Title  Pt will demo increased diaphragmatic excursion, thoracic mobility, proper coordination for pelvic floor lengthening on inhalation and IND to correct dyscoordination of pelvic floor lifting/ chest breathing without verbal cues in order to urinate properly with less urinary retention.     Time  4    Period  Weeks    Status  New      PT LONG TERM GOAL #8   Title  Pt will demo no lumbopelvic pertubation with deep core level 2 in order to demo improved postural stability to improve gait     Time  4    Period  Weeks    Status  New      PT LONG TERM GOAL  #9   TITLE  Pt will report being more aware when her bladder is full and be able to make it to the bathroom before leakage across 2 weeks in order to participate in community events.     Time  4    Period  Weeks    Status  New    Target Date  08/12/18            Plan - 07/15/18 1749    Clinical Impression Statement  Pt is making good progress with report of improved ability for sensing urinary urge, decreased frequency, and ability to relax pelvic floor with proper coordination technique. Pt showed good carry over with decreased thoracic mm tightness. Applied manual Tx to further address scars from prior surgeries over thorax to optimize diaphragmtic excursion and decrease R shoulder/ neck mm tightness. Hx of  surgeries include: B mastectomies and 2 ribs removal on R for jaw surgery which correlates with increased scar restrictions, intercostal restrictions on R > L.   Plan to add thoracolumbar strengthening at next session to strengthen R upper quadrant / L posterior lumbar/LE chain. Applied sit-to-stand  mechanics with co-activation of deep core mm. Pt demo'd difficulty and LOB on 3rd trial without BUE support. Instructed pt to utilize BUE on chair with coactivation of deep core mm when practicing sit to stand in daily tasks.   Pt continues to benefit from skilled PT    Rehab Potential  Good    Clinical Impairments Affecting Rehab Potential  patient highly motivated and committed to physical fitness, already attending silver sneakers class    PT Frequency  2x / week    PT Duration  8 weeks    PT Treatment/Interventions  Cryotherapy;Electrical Stimulation;Moist Heat;Gait training;DME Instruction;Stair training;Functional mobility training;Therapeutic activities;Therapeutic exercise;Balance training;Neuromuscular re-education;Patient/family education;Manual techniques;Energy conservation;Dry needling;ADLs/Self Care Home Management;Canalith Repostioning;Iontophoresis '4mg'$ /ml Dexamethasone;Traction;Ultrasound;Biofeedback;Aquatic Therapy;Passive range of motion;Cognitive remediation;Vestibular;Visual/perceptual remediation/compensation;Other (comment)   Pelvic floor therapy   PT Next Visit Plan  Pelvic therapy session, continue with strength, balance, and gait training    PT Home Exercise Plan  Continue with HEP and Silver sneakers    Consulted and Agree with Plan of Care  Patient       Patient will benefit from skilled therapeutic intervention in order to improve the following deficits and impairments:  Abnormal gait, Decreased endurance, Hypomobility, Decreased activity tolerance, Decreased strength, Pain, Difficulty walking, Decreased mobility, Decreased balance, Decreased range of motion, Improper body mechanics, Decreased safety awareness, Increased muscle spasms  Visit Diagnosis: Other abnormalities of gait and mobility  Muscle weakness (generalized)  Difficulty in walking, not elsewhere classified  Unsteadiness on feet  Urinary incontinence, unspecified type     Problem List There  are no active problems to display for this patient.   Jerl Mina ,PT, DPT, E-RYT  07/15/2018, 5:53 PM  [place] MAIN [facility] SERVICES [address] [place], [place], [address] Phone: [phone]   Fax:  [phone]  Name: Brittany Duran MRN: [id number] Date of Birth: Oct 27, 1950

## 2018-07-16 ENCOUNTER — Ambulatory Visit: Payer: Medicare Other

## 2018-07-16 DIAGNOSIS — M6281 Muscle weakness (generalized): Secondary | ICD-10-CM

## 2018-07-16 DIAGNOSIS — R2689 Other abnormalities of gait and mobility: Secondary | ICD-10-CM

## 2018-07-16 NOTE — Therapy (Signed)
[place] MAIN [facility] SERVICES [address] [place], [place], [address] Phone: [phone]   Fax:  [phone]  Physical Therapy Treatment  Patient Details  Name: Brittany Duran MRN: [id number] Date of Birth: March 19, 1951 Referring Provider (PT): Tawni Carnes, MD/ Elayne Guerin, MD   Encounter Date: 07/16/2018  PT End of Session - 07/16/18 1004    Visit Number  25    Number of Visits  39    Date for PT Re-Evaluation  08/29/18    Authorization Type  last goals: 06/25/18, eval 04/08/18    PT Start Time  0932    PT Stop Time  1015    PT Time Calculation (min)  43 min    Equipment Utilized During Treatment  Gait belt    Activity Tolerance  Patient tolerated treatment well    Behavior During Therapy  [facility] for tasks assessed/performed       Past Medical History:  Diagnosis Date  . Cancer ([place])   . Hypertension     Past Surgical History:  Procedure Laterality Date  . BREAST SURGERY    . MASTECTOMY      There were no vitals filed for this visit.  Subjective Assessment - 07/16/18 0942    Subjective  Pt states that she is doing well today. She denies any resting pain upon arrival. She got her MRI report from orthopedics which showed a stress reaction to her proximal tibia. She was advised that she can continue with therapy within pain tolerance.     Pertinent History  68 yo Female reports increased weakness in BLE; Patient had lumbar discectomy surgery in July 2019; Upon waking, patient was unable to move legs and was in the hospital for a week; She was discharged to short term rehab and came down with MRSA; patient underwent a second surgery in August 2019 at [facility]; She has had home health PT; Patient is now being referred to outpatient PT for weakness; She continues to have numbness in BLE to feet; She is ambulating with RW all the time; Patient reports having impaired proprioception and impaired gait mechanics; She reports recent  falls due to LLE buckling (last fall was end of Oct 2019); She reports that now she is having better quad control with less buckling;     How long can you sit comfortably?  30 min;     How long can you stand comfortably?  15-20 min;     How long can you walk comfortably?  40 min;     Diagnostic tests  X-rays shows large disc bulge at L4-L5    Patient Stated Goals  "I want to be able to walk better and get stronger" reduce fear of falling;     Currently in Pain?  No/denies          TREATMENT   Ther-ex Octane HIIT L4 x 5 minutes during history; Extensive conversation regarding MRI, pelvic therapy progress, activity modification, aquatic therapy; Supine L SLR 2 x 10; Hooklying clams with manual resistance 2 x 10; Hooklying adductor squeeze with manual resistance 2 x 10; Supine manually resisted hip abduction x 10;    Pt educated throughout session about proper posture and technique with exercises. Improved exercise technique, movement at target joints, use of target muscles after min to mod verbal, visual, tactile cues.   Ptdemonstratesgood motivation during session.She saw the orthopedist for a follow-up on her MRI. Per orthopedic note the patient had a left mid to distal  tibial shaft endosteal edema with cortical thickening which the radiologist states was suspicious for stress reaction. There is no hypointense fracture line. The patient also had mild diffuse left leg intramuscular edema, more pronounced in the distal gastrocnemius which the radiologist felt was possibly reactive. 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  She was advised to continue therapy but to avoid pain with exercise. Will try to arrange for some aquatic therapy sessions to allow for similar intensity of strengthening with less  loading through the LLE. Pt encouraged to follow-up with her PCP and oncologist regarding MRI results Willcontinue to progress balance, strength, and gait at follow-up sessions.She willbenefit from additional skilled PT intervention to improve strength, balance and gait safety.                       PT Short Term Goals - 06/25/18 1724      PT SHORT TERM GOAL #1   Title  Patient will be adherent to HEP at least 3x a week to improve functional strength and balance for better safety at home.    Time  2    Period  Weeks    Status  Achieved    Target Date  06/10/18      PT SHORT TERM GOAL #2   Title  Patient will improve lumbar AROM: flexion >30 degrees, lateral flexion >15 degrees bilaterally to improve functional ROM when picking up items from floor;     Baseline  05/13/18: flexion: 25 degrees, R lateral flexion: 29, L lateral flexion: 20; 06/25/18: flexion: 25 degrees, R lateral flexion: 22, L lateral flexion: 20;     Time  2    Period  Weeks    Status  Partially Met    Target Date  06/10/18        PT Long Term Goals - 07/15/18 1513      PT LONG TERM GOAL #1   Title  Patient (> 20 years old) will complete five times sit to stand test in < 15 seconds indicating an increased LE strength and improved balance.    Baseline  05/13/18: 12.7 seconds; 06/25/18: 11.0s    Time  6    Period  Weeks    Status  Achieved      PT LONG TERM GOAL #2   Title  Patient will increase Berg Balance score by > 6 points to demonstrate decreased fall risk during functional activities.    Baseline  04/08/18: 32/56, 05/13/18: 47/56; 06/25/18: 52/56    Time  6    Period  Weeks    Status  Achieved      PT LONG TERM GOAL #3   Title  Patient will increase six minute walk test distance to >1000 for progression to community ambulator and improve gait ability    Baseline  05/13/18: 1100'; 06/25/18: 1280';    Time  6    Period  Weeks    Status  Achieved      PT LONG TERM GOAL #4   Title  Patient  will increase BLE gross strength to 4+/5 as to improve functional strength for independent gait, increased standing tolerance and increased ADL ability.    Baseline  05/13/18: (R/L) Hip extension: 4-/4, Hip abduciton: 4/4-, Hip adduction: 4+/3+, Ankle DF: 4-/4-; 06/25/18: (R/L) Hip extension: 4-/4, Hip abduciton: 4/4-,  Hip adduction: 4+/3+, Ankle DF: 4/4    Time  6    Period  Weeks    Status  Partially Met      PT LONG TERM GOAL #5   Title  Pt will improve BERG by at least 3 points in order to demonstrate clinically significant improvement in balance.     Baseline  05/13/18: 47/56; 06/25/18: 52/56    Time  8    Period  Weeks    Status  Achieved      Additional Long Term Goals   Additional Long Term Goals  Yes      PT LONG TERM GOAL #6   Title  Pt will increase 6MWT by at least 36m(1631f in order to demonstrate clinically significant improvement in cardiopulmonary endurance and community ambulation     Baseline  05/13/18: 1100; 06/25/18: 1280'    Time  8    Period  Weeks    Status  Achieved      PT LONG TERM GOAL #7   Title  Pt will demo increased diaphragmatic excursion, thoracic mobility, proper coordination for pelvic floor lengthening on inhalation and IND to correct dyscoordination of pelvic floor lifting/ chest breathing without verbal cues in order to urinate properly with less urinary retention.     Time  4    Period  Weeks    Status  New      PT LONG TERM GOAL #8   Title  Pt will demo no lumbopelvic pertubation with deep core level 2 in order to demo improved postural stability to improve gait     Time  4    Period  Weeks    Status  New      PT LONG TERM GOAL  #9   TITLE  Pt will report being more aware when her bladder is full and be able to make it to the bathroom before leakage across 2 weeks in order to participate in community events.     Time  4    Period  Weeks    Status  New    Target Date  08/12/18            Plan - 07/16/18 1005    Clinical Impression  Statement  Ptdemonstratesgood motivation during session.She saw the orthopedist for a follow-up on her MRI. Per orthopedic note the patient had a left mid to distal tibial shaft endosteal edema with cortical thickening which the radiologist states was suspicious for stress reaction. There is no hypointense fracture line. The patient also had mild diffuse left leg intramuscular edema, more pronounced in the distal gastrocnemius which the radiologist felt was possibly reactive. 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  She was advised to continue therapy but to avoid pain with exercise. Will try to arrange for some aquatic therapy sessions to allow for similar intensity of strengthening with less loading through the LLE. Pt encouraged to follow-up with her PCP and oncologist regarding MRI results Willcontinue to progress balance, strength, and gait at follow-up sessions.She willbenefit from additional skilled PT intervention to improve strength, balance and gait safety.    Rehab Potential  Good    Clinical Impairments Affecting Rehab Potential  patient highly motivated and committed to physical fitness, already attending silver sneakers class    PT Frequency  2x / week  PT Duration  8 weeks    PT Treatment/Interventions  Cryotherapy;Electrical Stimulation;Moist Heat;Gait training;DME Instruction;Stair training;Functional mobility training;Therapeutic activities;Therapeutic exercise;Balance training;Neuromuscular re-education;Patient/family education;Manual techniques;Energy conservation;Dry needling;ADLs/Self Care Home Management;Canalith Repostioning;Iontophoresis 80m/ml Dexamethasone;Traction;Ultrasound;Biofeedback;Aquatic Therapy;Passive range of motion;Cognitive remediation;Vestibular;Visual/perceptual  remediation/compensation;Other (comment)   Pelvic floor therapy   PT Next Visit Plan  Continue with strength, balance, and gait training    PT Home Exercise Plan  Continue with HEP and Silver sneakers    Consulted and Agree with Plan of Care  Patient       Patient will benefit from skilled therapeutic intervention in order to improve the following deficits and impairments:  Abnormal gait, Decreased endurance, Hypomobility, Decreased activity tolerance, Decreased strength, Pain, Difficulty walking, Decreased mobility, Decreased balance, Decreased range of motion, Improper body mechanics, Decreased safety awareness, Increased muscle spasms  Visit Diagnosis: Other abnormalities of gait and mobility  Muscle weakness (generalized)     Problem List There are no active problems to display for this patient.  JPhillips GroutPT, DPT, GCS  Huprich,Jason 07/17/2018, 4:10 PM  [place] [facility] [address] [address]Phone: [phone]  Fax:  [phone] Name: VEVERLEE QUAKENBUSHMRN: [id number] of Birth: 106-Jul-1952

## 2018-07-18 ENCOUNTER — Ambulatory Visit: Payer: Medicare Other | Admitting: Speech Pathology

## 2018-07-18 ENCOUNTER — Ambulatory Visit: Payer: Medicare Other

## 2018-07-23 ENCOUNTER — Encounter: Payer: Medicare Other | Admitting: Speech Pathology

## 2018-07-23 ENCOUNTER — Ambulatory Visit: Payer: Medicare Other

## 2018-07-23 ENCOUNTER — Ambulatory Visit: Payer: Medicare Other | Admitting: Physical Therapy

## 2018-07-25 ENCOUNTER — Ambulatory Visit: Payer: Medicare Other

## 2018-07-26 ENCOUNTER — Encounter: Payer: Medicare Other | Admitting: Speech Pathology

## 2018-07-29 ENCOUNTER — Ambulatory Visit: Payer: Self-pay | Admitting: Urology

## 2018-07-30 ENCOUNTER — Ambulatory Visit: Payer: Medicare Other

## 2018-07-30 ENCOUNTER — Ambulatory Visit: Payer: Medicare Other | Admitting: Physical Therapy

## 2018-07-30 ENCOUNTER — Encounter: Payer: Medicare Other | Admitting: Speech Pathology

## 2018-07-30 NOTE — Therapy (Signed)
[place] MAIN [facility] SERVICES [address] [place], [place], [address] Phone: [phone]   Fax:  [phone]  Patient Details  Name: Brittany Duran MRN: [id number] Date of Birth: 10/29/1950 Referring Provider:  No ref. provider found  Encounter Date: 07/30/2018   PT called and left message for pt to notify of outpatient physical therapy clinic closure secondary to COVID-19 precautions. Therapist requested patient return call if needed with any questions or concerns regarding her home physical therapy program while clinic is closed. Therapist provided pt with clinic phone number/therapist's email on voicemail.    Phillips Grout PT, DPT, GCS  Huprich,Jason 07/30/2018, 3:14 PM  [place] MAIN [facility] SERVICES [address] [place], [place], [address] Phone: [phone]   Fax:  [phone]

## 2018-08-01 ENCOUNTER — Telehealth: Payer: Self-pay | Admitting: Physical Therapy

## 2018-08-01 ENCOUNTER — Ambulatory Visit: Payer: Medicare Other

## 2018-08-01 NOTE — Telephone Encounter (Signed)
Pelvic PT spoke with pt to f/u regarding past Pelvic PT. Pt states her ability to sense when her bladder full continues to be maintained. Pt continues to do her HEP without questions.  Pt's swelling in L leg has improved as pt's activities have decreased. Pt is using a WC in home to limit time on the walker.   Recommended a portable biker to place under chair to use for aerobic wellness. Place two more chairs with their backs one each side of forearms to rest and rest down to maintain upright posture while bicycling. Stretch on your bed legs afterwards.  Continue to follow weight bearing restrictions from your doctor to decrease the swelling in her L leg.   Pt voiced understanding.   Explained the possibility of telehealth services once that is in place at our facility. Pt stated interest.   If pt has any questions about her HEP, please email therapist. If pt has any questions further questions, please leave a message at front desk as messages will be attended during the clinic closure. Plan to communicate with pt when clinic opens again.

## 2018-08-02 ENCOUNTER — Encounter: Payer: Medicare Other | Admitting: Speech Pathology

## 2018-08-05 ENCOUNTER — Encounter: Payer: Medicare Other | Admitting: Physical Therapy

## 2018-08-06 ENCOUNTER — Ambulatory Visit: Payer: Medicare Other

## 2018-08-08 ENCOUNTER — Ambulatory Visit: Payer: Medicare Other | Attending: Family Medicine

## 2018-08-12 ENCOUNTER — Encounter: Payer: Medicare Other | Admitting: Physical Therapy

## 2018-08-19 ENCOUNTER — Encounter: Payer: Medicare Other | Admitting: Physical Therapy

## 2018-08-19 NOTE — Therapy (Signed)
[place] MAIN [facility] SERVICES [address] [place], [place], [address] Phone: [phone]   Fax:  [phone]  Patient Details  Name: Brittany Duran MRN: [id number] Date of Birth: 15-Nov-1950 Referring Provider:  No ref. provider found  Encounter Date: 08/19/2018  The [facility] outpatient clinics are closed at this time due to the COVID-19 epidemic. The patient was contacted in regards to their therapy services. The patient currently refuses telehealth or home health PT at this time. The patient is in agreement that they are safe and consent to being on hold for therapy services until the [facility] outpatient facilities reopen. At which time, the patient will be contacted to schedule an appointment to resume therapy services.      Phillips Grout PT, DPT, GCS  Shahiem Bedwell 08/19/2018, 2:34 PM  [place] MAIN [facility] SERVICES [address] [place], [place], [address] Phone: [phone]   Fax:  [phone]
